# Patient Record
Sex: Female | Born: 1960 | Race: White | Hispanic: No | Smoking: Former smoker
Health system: Southern US, Community
[De-identification: ages and names within clinical notes are randomized; demographics above are authoritative.]

## PROBLEM LIST (undated history)

## (undated) DIAGNOSIS — K219 Gastro-esophageal reflux disease without esophagitis: Secondary | ICD-10-CM

## (undated) DIAGNOSIS — B001 Herpesviral vesicular dermatitis: Secondary | ICD-10-CM

## (undated) HISTORY — PX: TUBAL LIGATION: SHX77

## (undated) HISTORY — PX: BREAST SURGERY: SHX581

## (undated) HISTORY — PX: AUGMENTATION MAMMAPLASTY: SUR837

---

## 1998-10-11 ENCOUNTER — Other Ambulatory Visit: Admission: RE | Admit: 1998-10-11 | Discharge: 1998-10-11 | Payer: Self-pay | Admitting: Obstetrics & Gynecology

## 1999-11-17 ENCOUNTER — Other Ambulatory Visit: Admission: RE | Admit: 1999-11-17 | Discharge: 1999-11-17 | Payer: Self-pay | Admitting: Obstetrics & Gynecology

## 2000-12-21 ENCOUNTER — Other Ambulatory Visit: Admission: RE | Admit: 2000-12-21 | Discharge: 2000-12-21 | Payer: Self-pay | Admitting: Obstetrics & Gynecology

## 2002-02-07 ENCOUNTER — Other Ambulatory Visit: Admission: RE | Admit: 2002-02-07 | Discharge: 2002-02-07 | Payer: Self-pay | Admitting: Obstetrics & Gynecology

## 2003-02-19 ENCOUNTER — Other Ambulatory Visit: Admission: RE | Admit: 2003-02-19 | Discharge: 2003-02-19 | Payer: Self-pay | Admitting: Obstetrics & Gynecology

## 2003-03-23 ENCOUNTER — Ambulatory Visit (HOSPITAL_COMMUNITY): Admission: RE | Admit: 2003-03-23 | Discharge: 2003-03-23 | Payer: Self-pay | Admitting: Obstetrics & Gynecology

## 2005-01-27 ENCOUNTER — Other Ambulatory Visit: Admission: RE | Admit: 2005-01-27 | Discharge: 2005-01-27 | Payer: Self-pay | Admitting: Obstetrics & Gynecology

## 2005-10-08 ENCOUNTER — Encounter: Admission: RE | Admit: 2005-10-08 | Discharge: 2005-10-08 | Payer: Self-pay | Admitting: Family Medicine

## 2008-09-06 ENCOUNTER — Encounter: Admission: RE | Admit: 2008-09-06 | Discharge: 2008-09-06 | Payer: Self-pay | Admitting: Obstetrics & Gynecology

## 2009-09-09 ENCOUNTER — Encounter: Admission: RE | Admit: 2009-09-09 | Discharge: 2009-09-09 | Payer: Self-pay | Admitting: Obstetrics & Gynecology

## 2010-09-10 ENCOUNTER — Encounter: Admission: RE | Admit: 2010-09-10 | Discharge: 2010-09-10 | Payer: Self-pay | Admitting: Obstetrics & Gynecology

## 2010-11-02 HISTORY — PX: ABLATION: SHX5711

## 2011-03-20 NOTE — Op Note (Signed)
[name], Hannah Reese                          ACCOUNT NO.:  [id number]   MEDICAL RECORD NO.:  [id number]                   PATIENT TYPE:  AMB   LOCATION:  [facility]                                  FACILITY:  [facility]   PHYSICIAN:  Ilda Mori, M.D.                DATE OF BIRTH:  04/10/1961   DATE OF PROCEDURE:  03/23/2003  DATE OF DISCHARGE:                                 OPERATIVE REPORT   PREOPERATIVE DIAGNOSIS:  Voluntary sterilization.   POSTOPERATIVE DIAGNOSES:  1. Voluntary sterilization.  2. 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   PROCEDURES:  1. Bilateral tubal cautery for sterilization.  2. Excision of hydatid of Morgagni.   SURGEON:  Ilda Mori, M.D.   ANESTHESIA:  General endotracheal.   ESTIMATED BLOOD LOSS:  10 mL.   FINDINGS:  The right tube was perfectly normal, and the right ovary was  perfectly normal.  The left tube had a 1 cm hydatid of Morgagni cyst on a 3  cm stalk.  The left ovary appeared normal, but there were some filmy  adhesions from the left ovary to the posterior uterine serosa.  The rest of  the pelvis was free of adhesions or any evidence of endometriosis.   INDICATIONS:  This is a 50 year old gravida 2, para 2, who requests  sterilization.  A two to five per thousand failure rate was discussed with  the patient as well as the fact that the procedure was permanent and  alternative forms of nonpermanent birth control were available.   DESCRIPTION OF PROCEDURE:  The patient was taken to the operating room and  general endotracheal anesthesia was induced.  She was placed in the dorsal  lithotomy position.  The lower abdomen, vagina, and perineum were prepped  and draped in a sterile fashion.  A Veress needle was introduced through the  cul-de-sac and a pneumoperitoneum was created, and the bladder was emptied.  The Hulka tenaculum was introduced to the cervix and affixed to the anterior  lip of the cervix.  The surgeon regowned and gloved, and  local anesthesia  was used in the incision site.  The incision was then made in the umbilical  area and the 5 mm trocar was introduced.  The 5 mm laparoscope was then used  to visualize the pelvis.  An accessory 5 mm port was then placed in the  suprapubic area under direct visualization and the Kleppinger cautery  forceps was introduced.  The left tube was grasped at the isthmic halfway  junction and cauterized along a 3-4 cm length until no current was flowing  through the tube.  Normal-appearing tube of 1.5-2 cm was left proximal to  the burn site.  The identical procedure was then carried out on the right  fallopian tube.  The left tube was noted to have a 1 cm hydatid cyst on a 3  cm stalk.  To  prevent possible  torsion and pain from the hydatid, the stalk was cauterized and cut and the  hydatid was left free in the peritoneal cavity.  The procedure was then  terminated.  The instruments were removed.  The incisions were closed with  Dermabond adhesive and the patient left the operating room in good  condition.                                                Ilda Mori, M.D.    RK/MEDQ  D:  03/23/2003  T:  03/23/2003  Job:  [id number]

## 2011-06-04 ENCOUNTER — Encounter (HOSPITAL_COMMUNITY)
Admission: RE | Admit: 2011-06-04 | Discharge: 2011-06-04 | Disposition: A | Discharge status: Home / Self Care | Payer: BC Managed Care – PPO | Source: Ambulatory Visit | Attending: Obstetrics & Gynecology | Admitting: Obstetrics & Gynecology

## 2011-06-04 ENCOUNTER — Encounter (HOSPITAL_COMMUNITY): Payer: Self-pay

## 2011-06-04 HISTORY — DX: Gastro-esophageal reflux disease without esophagitis: K21.9

## 2011-06-04 HISTORY — DX: Herpesviral vesicular dermatitis: B00.1

## 2011-06-04 LAB — CBC
HCT: 41 % (ref 36.0–46.0)
Hemoglobin: 13.8 g/dL (ref 12.0–15.0)
MCH: 31.9 pg (ref 26.0–34.0)
MCHC: 33.7 g/dL (ref 30.0–36.0)

## 2011-06-04 NOTE — Anesthesia Preprocedure Evaluation (Signed)
Anesthesia Evaluation  Name, MR# and DOB Patient awake  General Assessment Comment  Reviewed: Allergy & Precautions, H&P  and Patient's Chart, lab work & pertinent test results  Airway Mallampati: I TM Distance: >3 FB Neck ROM: Full    Dental No notable dental hx (+) Teeth Intact   Pulmonaryneg pulmonary ROS    clear to auscultation  pulmonary exam normal   Cardiovascular Regular Normal   Neuro/PsychNegative Neurological ROS Negative Psych ROS  GI/Hepatic/Renal negative GI ROS, negative Liver ROS, and negative Renal ROS (+)  GERD Controlled and Medicated     Endo/Other  Negative Endocrine ROS (+)   Abdominal Normal abdominal exam  (+)   Musculoskeletal negative musculoskeletal ROS (+)  Hematology negative hematology ROS (+)   Peds  Reproductive/Obstetrics negative OB ROS   Anesthesia Other Findings             Anesthesia Physical Anesthesia Plan  ASA: II  Anesthesia Plan: General   Post-op Pain Management:    Induction: Intravenous  Airway Management Planned: LMA  Additional Equipment:   Intra-op Plan:   Post-operative Plan:   Informed Consent: I have reviewed the patients History and Physical, chart, labs and discussed the procedure including the risks, benefits and alternatives for the proposed anesthesia with the patient or authorized representative who has indicated his/her understanding and acceptance.     Plan Discussed with: Anesthesiologist (AP)  Anesthesia Plan Comments:         Anesthesia Quick Evaluation

## 2011-06-04 NOTE — Patient Instructions (Addendum)
20 Hannah Reese  06/04/2011   Your procedure is scheduled on:  8/10  Report to [facility] at 6 AM.  Call this number if you have problems the morning of surgery: [phone]   Remember:   Do not eat food:After Midnight.  Do not drink clear liquids: After Midnight.  Take these medicines the morning of surgery with A SIP OF WATER: Omeprazole   Do not wear jewelry, make-up or nail polish.  Do not wear lotions, powders, or perfumes. You may wear deodorant.  Do not shave 48 hours prior to surgery.  Do not bring valuables to the hospital.  Contacts, dentures or bridgework may not be worn into surgery.  Leave suitcase in the car. After surgery it may be brought to your room.  For patients admitted to the hospital, checkout time is 11:00 AM the day of discharge.   Patients discharged the day of surgery will not be allowed to drive home.  Name and phone number of your driver: daughter  Georga Bora  [phone]  Special Instructions:  Please read over the following fact sheets that you were given: use CHG wash per written instruction sheet

## 2011-06-11 ENCOUNTER — Encounter (HOSPITAL_COMMUNITY): Payer: Self-pay | Admitting: Obstetrics & Gynecology

## 2011-06-11 DIAGNOSIS — N393 Stress incontinence (female) (male): Secondary | ICD-10-CM | POA: Diagnosis present

## 2011-06-11 DIAGNOSIS — N920 Excessive and frequent menstruation with regular cycle: Secondary | ICD-10-CM | POA: Diagnosis present

## 2011-06-11 NOTE — H&P (Signed)
Hannah Reese is an 50 y.o. female who presents with a year history of urinary stress incontinence and menorrhagia (7 days of moderate to heavy bleeding per cycle.   Pertinent Gynecological History:  Last mammogram: normal Date: 2011 Last pap: normal Date: 2012 OB History: G1, P1    Past Medical History  Diagnosis Date  . GERD (gastroesophageal reflux disease)   . Recurrent cold sores     Past Surgical History  Procedure Date  . Breast surgery   . Tubal ligation     History reviewed. No pertinent family history.  Social History:  reports that she has quit smoking. She does not have any smokeless tobacco history on file. She reports that she drinks alcohol. She reports that she does not use illicit drugs.  Allergies: No Known Allergies  No prescriptions prior to admission    Review of Systems  All other systems reviewed and are negative.    There were no vitals taken for this visit. Physical Exam  Constitutional: She is oriented to person, place, and time. She appears well-developed.  HENT:  Head: Normocephalic.  Eyes: Pupils are equal, round, and reactive to light.  Neck: Normal range of motion. No thyromegaly present.  Cardiovascular: Normal rate, regular rhythm and normal heart sounds.   Respiratory: Effort normal and breath sounds normal.  GI: Soft.  Genitourinary: Vagina normal and uterus normal.       Mild cystocele and increased urethral mobility.  Neurological: She is alert and oriented to person, place, and time.  Skin: Skin is warm and dry.  Psychiatric: Her behavior is normal.    No results found for this or any previous visit (from the past 24 hour(s)).  No results found.  Assessment/Plan: Menorrhagia and USI.  Discussed options with patient and she requests surgical correction of these problems.  She is admitted for day surgery to include hysteroscopy, D&C, Nova sure endometrial ablation, and Monarc TOT.  Marie Borowski D 06/11/2011, 3:04 PM

## 2011-06-12 ENCOUNTER — Ambulatory Visit (HOSPITAL_COMMUNITY)
Admission: RE | Admit: 2011-06-12 | Discharge: 2011-06-12 | Disposition: A | Discharge status: Home / Self Care | Payer: BC Managed Care – PPO | Source: Ambulatory Visit | Attending: Obstetrics & Gynecology | Admitting: Obstetrics & Gynecology

## 2011-06-12 ENCOUNTER — Ambulatory Visit (HOSPITAL_COMMUNITY): Payer: BC Managed Care – PPO | Admitting: Anesthesiology

## 2011-06-12 ENCOUNTER — Encounter (HOSPITAL_COMMUNITY): Payer: Self-pay | Admitting: Anesthesiology

## 2011-06-12 ENCOUNTER — Other Ambulatory Visit: Payer: Self-pay | Admitting: Obstetrics & Gynecology

## 2011-06-12 ENCOUNTER — Encounter (HOSPITAL_COMMUNITY)
Admission: RE | Disposition: A | Discharge status: Home / Self Care | Payer: Self-pay | Source: Ambulatory Visit | Attending: Obstetrics & Gynecology

## 2011-06-12 DIAGNOSIS — Z01818 Encounter for other preprocedural examination: Secondary | ICD-10-CM | POA: Insufficient documentation

## 2011-06-12 DIAGNOSIS — N920 Excessive and frequent menstruation with regular cycle: Secondary | ICD-10-CM

## 2011-06-12 DIAGNOSIS — N393 Stress incontinence (female) (male): Secondary | ICD-10-CM

## 2011-06-12 DIAGNOSIS — Z01812 Encounter for preprocedural laboratory examination: Secondary | ICD-10-CM | POA: Insufficient documentation

## 2011-06-12 HISTORY — PX: BLADDER SUSPENSION: SHX72

## 2011-06-12 SURGERY — TRANSVAGINAL TAPE (TVT) PROCEDURE
Anesthesia: Choice

## 2011-06-12 MED ORDER — CEFAZOLIN SODIUM 1-5 GM-% IV SOLN
INTRAVENOUS | Status: DC | PRN
  Administered 2011-06-12: 1 g via INTRAVENOUS

## 2011-06-12 MED ORDER — FENTANYL CITRATE 0.05 MG/ML IJ SOLN
INTRAMUSCULAR | Status: DC | PRN
  Administered 2011-06-12: 25 ug via INTRAVENOUS
  Administered 2011-06-12: 100 ug via INTRAVENOUS

## 2011-06-12 MED ORDER — PROPOFOL 10 MG/ML IV EMUL
INTRAVENOUS | Status: AC
  Filled 2011-06-12: qty 20

## 2011-06-12 MED ORDER — FENTANYL CITRATE 0.05 MG/ML IJ SOLN
INTRAMUSCULAR | Status: AC
  Filled 2011-06-12: qty 5

## 2011-06-12 MED ORDER — OXYCODONE-ACETAMINOPHEN 5-325 MG PO TABS: 1.0000 | ORAL_TABLET | ORAL | Status: AC | PRN

## 2011-06-12 MED ORDER — ONDANSETRON HCL 4 MG/2ML IJ SOLN
INTRAMUSCULAR | Status: DC | PRN
  Administered 2011-06-12: 4 mg via INTRAVENOUS

## 2011-06-12 MED ORDER — BUPIVACAINE-EPINEPHRINE 0.5% -1:200000 IJ SOLN
INTRAMUSCULAR | Status: DC | PRN
  Administered 2011-06-12: 10 mL

## 2011-06-12 MED ORDER — LIDOCAINE HCL (CARDIAC) 20 MG/ML IV SOLN
INTRAVENOUS | Status: AC
  Filled 2011-06-12: qty 5

## 2011-06-12 MED ORDER — FENTANYL CITRATE 0.05 MG/ML IJ SOLN: 25.0000 ug | INTRAMUSCULAR | Status: DC | PRN

## 2011-06-12 MED ORDER — KETOROLAC TROMETHAMINE 30 MG/ML IJ SOLN
INTRAMUSCULAR | Status: DC | PRN
  Administered 2011-06-12: 30 mg via INTRAVENOUS

## 2011-06-12 MED ORDER — MIDAZOLAM HCL 2 MG/2ML IJ SOLN
INTRAMUSCULAR | Status: AC
  Filled 2011-06-12: qty 2

## 2011-06-12 MED ORDER — CEFAZOLIN SODIUM 1-5 GM-% IV SOLN: 1.0000 g | INTRAVENOUS | Status: DC

## 2011-06-12 MED ORDER — MEPERIDINE HCL 25 MG/ML IJ SOLN: 6.2500 mg | INTRAMUSCULAR | Status: DC | PRN

## 2011-06-12 MED ORDER — DEXAMETHASONE SODIUM PHOSPHATE 10 MG/ML IJ SOLN
INTRAMUSCULAR | Status: AC
  Filled 2011-06-12: qty 1

## 2011-06-12 MED ORDER — LACTATED RINGERS IV SOLN
INTRAVENOUS | Status: DC | PRN
  Administered 2011-06-12: 3000 mL via INTRAVENOUS

## 2011-06-12 MED ORDER — ONDANSETRON HCL 4 MG/2ML IJ SOLN
INTRAMUSCULAR | Status: AC
  Filled 2011-06-12: qty 2

## 2011-06-12 MED ORDER — LACTATED RINGERS IV SOLN
INTRAVENOUS | Status: DC
Start: 2011-06-12 — End: 2011-06-12
  Administered 2011-06-12: 09:00:00 via INTRAVENOUS
  Administered 2011-06-12: 1000 mL via INTRAVENOUS

## 2011-06-12 MED ORDER — PROPOFOL 10 MG/ML IV EMUL
INTRAVENOUS | Status: DC | PRN
  Administered 2011-06-12: 150 mg via INTRAVENOUS

## 2011-06-12 MED ORDER — CEFAZOLIN SODIUM 1-5 GM-% IV SOLN
INTRAVENOUS | Status: AC
  Filled 2011-06-12: qty 50

## 2011-06-12 MED ORDER — LIDOCAINE HCL (CARDIAC) 20 MG/ML IV SOLN
INTRAVENOUS | Status: DC | PRN
  Administered 2011-06-12: 80 mg via INTRAVENOUS

## 2011-06-12 MED ORDER — KETOROLAC TROMETHAMINE 30 MG/ML IJ SOLN
INTRAMUSCULAR | Status: AC
  Filled 2011-06-12: qty 1

## 2011-06-12 MED ORDER — DEXAMETHASONE SODIUM PHOSPHATE 4 MG/ML IJ SOLN
INTRAMUSCULAR | Status: DC | PRN
  Administered 2011-06-12: 6 mg via INTRAVENOUS

## 2011-06-12 MED ORDER — MIDAZOLAM HCL 5 MG/5ML IJ SOLN
INTRAMUSCULAR | Status: DC | PRN
  Administered 2011-06-12: 2 mg via INTRAVENOUS

## 2011-06-12 SURGICAL SUPPLY — 30 items
ABLATOR ENDOMETRIAL BIPOLAR (ABLATOR) ×2 IMPLANT
CANISTER SUCTION 2500CC (MISCELLANEOUS) ×2 IMPLANT
CATH ROBINSON RED A/P 16FR (CATHETERS) IMPLANT
CLOTH BEACON ORANGE TIMEOUT ST (SAFETY) ×2 IMPLANT
CONTAINER PREFILL 10% NBF 60ML (FORM) IMPLANT
DECANTER SPIKE VIAL GLASS SM (MISCELLANEOUS) IMPLANT
DERMABOND ADVANCED (GAUZE/BANDAGES/DRESSINGS) ×2 IMPLANT
DRAPE CAMERA CLOSED 9X96 (DRAPES) IMPLANT
DRAPE UTILITY XL STRL (DRAPES) ×2 IMPLANT
GAUZE PACKING 2X5 YD STERILE (GAUZE/BANDAGES/DRESSINGS) IMPLANT
GLOVE ECLIPSE 6.0 STRL STRAW (GLOVE) ×4 IMPLANT
GLOVE ECLIPSE 6.5 STRL STRAW (GLOVE) ×2 IMPLANT
GOWN PREVENTION PLUS LG XLONG (DISPOSABLE) ×8 IMPLANT
NEEDLE HYPO 22GX1.5 SAFETY (NEEDLE) ×2 IMPLANT
NEEDLE SPNL 22GX3.5 QUINCKE BK (NEEDLE) ×2 IMPLANT
NS IRRIG 1000ML POUR BTL (IV SOLUTION) ×2 IMPLANT
PACK HYSTEROSCOPY LF (CUSTOM PROCEDURE TRAY) ×2 IMPLANT
PACK VAGINAL WOMENS (CUSTOM PROCEDURE TRAY) ×2 IMPLANT
PAD PREP 24X48 CUFFED NSTRL (MISCELLANEOUS) ×2 IMPLANT
PLUG CATH AND CAP STER (CATHETERS) IMPLANT
SET CYSTO W/LG BORE CLAMP LF (SET/KITS/TRAYS/PACK) IMPLANT
SUBFASCIAL HAMMOCK MONARCH (Sling) ×2 IMPLANT
SUT VIC AB 2-0 SH 27 (SUTURE) ×3
SUT VIC AB 2-0 SH 27XBRD (SUTURE) ×3 IMPLANT
SUT VIC AB 2-0 UR6 27 (SUTURE) IMPLANT
SUT VIC AB 3-0 SH 27 (SUTURE) ×2
SUT VIC AB 3-0 SH 27XBRD (SUTURE) ×2 IMPLANT
TOWEL OR 17X24 6PK STRL BLUE (TOWEL DISPOSABLE) ×4 IMPLANT
TRAY FOLEY CATH 14FR (SET/KITS/TRAYS/PACK) ×2 IMPLANT
WATER STERILE IRR 1000ML POUR (IV SOLUTION) IMPLANT

## 2011-06-12 NOTE — Op Note (Signed)
Hannah Reese, Hannah Reese NO.:  [id number]  MEDICAL RECORD NO.:  [id number]  LOCATION:  [facility]                          FACILITY:  [facility]  PHYSICIAN:  Ilda Mori, M.D.   DATE OF BIRTH:  [birth year]  DATE OF PROCEDURE:  06/12/2011 DATE OF DISCHARGE:  06/12/2011                              OPERATIVE REPORT   PREOPERATIVE DIAGNOSES:  Urinary stress incontinence and menorrhagia.  POSTOPERATIVE DIAGNOSES:  Urinary stress incontinence and menorrhagia.  PROCEDURE:  Hysteroscopy, D and C, NovaSure endometrial ablation, transobturator tape urethral suspension.  SURGEON:  Ilda Mori  ANESTHESIA:  General endotracheal.  ESTIMATED BLOOD LOSS:  100 mL.  FINDINGS:  The uterine cavity was small and atrophic.  There was a small polyp noted at the right tubal ostia, the rest of the cavity appeared normal.  In the vagina, there was a 1+ cystocele and slight increased urethral mobility noted.  COMPLICATIONS:  None.  INDICATIONS:  This is a 50 year old female with over a year history of heavy periods and persistent urinary stress incontinence.  These problems were discussed with the patient, alternative modes of treatment were discussed and decision was made to proceed with NovaSure ablation and transobturator tape urethral suspension.  PROCEDURE IN DETAIL:  The patient was brought to the operating room and general anesthesia was induced.  She was then placed in the dorsal lithotomy position and the perineum and vagina were prepped and draped in sterile fashion and the bladder was catheterized with a Foley catheter. The cervix was identified infiltrated with 5 mL of 0.5% bupivacaine and 1:200,000 epinephrine.  The cervix was sounded to 3 cm.  The uterine cavity was sounded to 9 cm leaving a cavity depth of 6 cm.  The internal os was then dilated with Shawnie Pons dilators to a 21-French.  A diagnostic hysteroscope was introduced.  A small polyp was seen at the right  tubal ostia.  The rest of the endometrium looked normal.  A polyp forceps was used to remove the polyp that had been noted.  The endocervical os was then dilated to 25-French.  The NovaSure was placed, and the CO2 test passed.  The NovaSure was deployed and the cavity width was only 2.5 cm and it remained such even after manipulating the instrument.  The ablation was then carried out for 64 seconds.  The hysteroscope was reintroduced and an adequate endometrial burn was noted to have taken place.  The attention was then turned to the suburethral area.  A vertical incision was made beginning 0.5 cm below the urethral meatus for approximately 3 cm in length.  The mucosa was then dissected free, so that a tunnel was created to the ischiopubic rami bilaterally.  Incisions were then made over the obturator foramena bilaterally which were 5 cm lateral to the clitoral hood.  The Monarch needles (left and right) were then passed from these groin incisions through the obturator foramina onto the surgeon's finger which was placed in the left and then the right vaginal tunnel.  The Monarch mesh tape was then affixed to the needles, and the needles were removed, leaving a sling of mesh underneath the mid urethra.  A Kelly clamp was  placed between the urethra and the mesh to obtain the proper tension.  The plastic sheath was removed and the tape was cut at the level of the Skin in the right and left groin, and these incisions were closed with Dermabond.  The suburethral vaginal mucosa was then closed with interrupted 3-0 Vicryl suture.  The Foley catheter was then disconnected from the bag and the bladder was completely emtied.  The urine was clear.  The bladder was then filled retrograde through the foley with 220 mL of saline to expedite a post operative voiding trial prior to discharge from PACU, and the Foley was removed.  The procedure was then terminated and the patient left the operating room in good  condition.    Ilda Mori, M.D.     RK/MEDQ  D:  06/12/2011  T:  06/12/2011  Job:  [id number]

## 2011-06-12 NOTE — Brief Op Note (Addendum)
06/12/2011  10:05 AM  PATIENT:  Hannah Reese  50 y.o. female  PRE-OPERATIVE DIAGNOSIS:  stress urinary incontinence,  Menorrhagia  POST-OPERATIVE DIAGNOSIS:  stress urinary incontinence, Menorrhagia  PROCEDURE:  Procedure(s): TRANSOBTURATOR TAPE (TOT) PROCEDURE DILATATION & CURETTAGE/HYSTEROSCOPY WITH NOVASURE ABLATION  SURGEON:  Surgeon(s): Aadin Gaut Rosalio Macadamia  PHYSICIAN ASSISTANT:   ASSISTANTS: none   ANESTHESIA:   general  ESTIMATED BLOOD LOSS: 100 ML   BLOOD ADMINISTERED:none  DRAINS: none   LOCAL MEDICATIONS USED:  BUPIVICAINE 10 CC  SPECIMEN: Endometrial tissue, endometrial polyp DISPOSITION OF SPECIMEN:  PATHOLOGY  COUNTS:  YES  TOURNIQUET:  * No tourniquets in log *  DICTATION #: [id number]  PLAN OF CARE: Discharge from PACU  PATIENT DISPOSITION:  PACU - hemodynamically stable.   Delay start of Pharmacological VTE agent (>24hrs) due to surgical blood loss or risk of bleeding:  no

## 2011-06-12 NOTE — Anesthesia Postprocedure Evaluation (Signed)
  Anesthesia Post-op Note  Patient: Hannah Reese  Procedure(s) Performed:  TRANSVAGINAL TAPE (TVT) PROCEDURE - Transobturator; DILATATION & CURETTAGE/HYSTEROSCOPY WITH NOVASURE ABLATION - Suction Dilatation and Evacuation  Patient Location: PACU  Anesthesia Type: General  Level of Consciousness: awake, alert  and oriented  Airway and Oxygen Therapy: Patient Spontanous Breathing  Post-op Pain: none  Post-op Assessment: Post-op Vital signs reviewed and Patient's Cardiovascular Status Stable  Post-op Vital Signs: Reviewed and stable  Complications: No apparent anesthesia complications

## 2011-06-12 NOTE — Transfer of Care (Signed)
Immediate Anesthesia Transfer of Care Note  Patient: Hannah Reese  Procedure(s) Performed:  TRANSVAGINAL TAPE (TVT) PROCEDURE - Transobturator; DILATATION & CURETTAGE/HYSTEROSCOPY WITH NOVASURE ABLATION - Suction Dilatation and Evacuation  Patient Location: PACU  Anesthesia Type: General  Level of Consciousness: awake, alert  and oriented  Airway & Oxygen Therapy: Patient Spontanous Breathing and Patient connected to nasal cannula oxygen  Post-op Assessment: Report given to PACU RN and Post -op Vital signs reviewed and stable  Post vital signs: Reviewed and stable  Complications: No apparent anesthesia complications

## 2011-07-08 ENCOUNTER — Encounter (HOSPITAL_COMMUNITY): Payer: Self-pay | Admitting: Obstetrics & Gynecology

## 2011-09-07 ENCOUNTER — Other Ambulatory Visit: Payer: Self-pay | Admitting: Obstetrics & Gynecology

## 2011-09-07 DIAGNOSIS — Z1231 Encounter for screening mammogram for malignant neoplasm of breast: Secondary | ICD-10-CM

## 2011-10-01 ENCOUNTER — Ambulatory Visit
Admission: RE | Admit: 2011-10-01 | Discharge: 2011-10-01 | Disposition: A | Discharge status: Home / Self Care | Payer: BC Managed Care – PPO | Source: Ambulatory Visit | Attending: Obstetrics & Gynecology | Admitting: Obstetrics & Gynecology

## 2011-10-01 DIAGNOSIS — Z1231 Encounter for screening mammogram for malignant neoplasm of breast: Secondary | ICD-10-CM

## 2012-08-31 ENCOUNTER — Other Ambulatory Visit: Payer: Self-pay | Admitting: Obstetrics & Gynecology

## 2012-08-31 DIAGNOSIS — Z1231 Encounter for screening mammogram for malignant neoplasm of breast: Secondary | ICD-10-CM

## 2012-10-06 ENCOUNTER — Ambulatory Visit
Admission: RE | Admit: 2012-10-06 | Discharge: 2012-10-06 | Disposition: A | Discharge status: Home / Self Care | Payer: BC Managed Care – PPO | Source: Ambulatory Visit | Attending: Obstetrics & Gynecology | Admitting: Obstetrics & Gynecology

## 2012-10-06 DIAGNOSIS — Z1231 Encounter for screening mammogram for malignant neoplasm of breast: Secondary | ICD-10-CM

## 2013-02-06 ENCOUNTER — Encounter: Payer: Self-pay | Admitting: Nurse Practitioner

## 2013-02-06 ENCOUNTER — Ambulatory Visit (INDEPENDENT_AMBULATORY_CARE_PROVIDER_SITE_OTHER): Payer: BC Managed Care – PPO | Admitting: Nurse Practitioner

## 2013-02-06 VITALS — BP 105/70 | HR 65 | Temp 97.9°F | Ht 63.0 in | Wt 164.0 lb

## 2013-02-06 DIAGNOSIS — B009 Herpesviral infection, unspecified: Secondary | ICD-10-CM

## 2013-02-06 DIAGNOSIS — M545 Low back pain, unspecified: Secondary | ICD-10-CM

## 2013-02-06 DIAGNOSIS — K219 Gastro-esophageal reflux disease without esophagitis: Secondary | ICD-10-CM

## 2013-02-06 MED ORDER — CYCLOBENZAPRINE HCL 5 MG PO TABS: 5.0000 mg | ORAL_TABLET | Freq: Three times a day (TID) | ORAL | Status: DC | PRN

## 2013-02-06 MED ORDER — IBUPROFEN 800 MG PO TABS: 800.0000 mg | ORAL_TABLET | Freq: Three times a day (TID) | ORAL | Status: DC | PRN

## 2013-02-06 MED ORDER — OMEPRAZOLE 40 MG PO CPDR: 40.0000 mg | DELAYED_RELEASE_CAPSULE | Freq: Every day | ORAL | Status: DC

## 2013-02-06 MED ORDER — VALACYCLOVIR HCL 1 G PO TABS: 500.0000 mg | ORAL_TABLET | Freq: Every day | ORAL | Status: DC

## 2013-02-06 MED ORDER — IBUPROFEN 800 MG PO TABS: 800.0000 mg | ORAL_TABLET | Freq: Three times a day (TID) | ORAL | Status: AC | PRN

## 2013-02-06 NOTE — Progress Notes (Signed)
  Subjective:    Patient ID: Hannah Reese, female    DOB: [birth year], 52 y.o.   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 Patient takes Valtrex 1g daily. No recent outbreaks GERD This is a chronic problem. She is currently on Omperazole which works very well for her. Low back pain Usually occurs after she has done a lot physical activity. Will last for 2 days when starts. Standing increases pain. Motrin helps. Denies numbness or tingling down legs.   Review of Systems  Constitutional: Negative.   HENT: Negative.   Eyes: Negative.   Respiratory: Negative.   Cardiovascular: Negative.   Gastrointestinal: Negative.   Genitourinary: Negative.   Musculoskeletal: Positive for back pain.  Allergic/Immunologic: Negative.   Neurological: Negative.  Negative for numbness.  Hematological: Negative.   Psychiatric/Behavioral: Negative.        Objective:   Physical Exam  Constitutional: She is oriented to person, place, and time. She appears well-developed and well-nourished.  Cardiovascular: Normal rate, normal heart sounds and intact distal pulses.   No murmur heard. Pulmonary/Chest: Effort normal and breath sounds normal.  Abdominal: Soft. Bowel sounds are normal.  Musculoskeletal: Normal range of motion.  Normal ROM lumbar spine. DTR's = bil (-)SLR bil Motor strength and sensation intact distally bil  Neurological: She is alert and oriented to person, place, and time.  Skin: Skin is warm and dry.  Psychiatric: She has a normal mood and affect. Her behavior is normal. Judgment and thought content normal.          Assessment & Plan:  Low back pain - Plan: cyclobenzaprine (FLEXERIL) 5 MG tablet, ibuprofen (ADVIL,MOTRIN) 800 MG tablet  HSV-1 (herpes simplex virus 1) infection - Plan: valACYclovir (VALTREX) 1000 MG tablet  GERD (gastroesophageal reflux disease) - Plan: omeprazole (PRILOSEC) 40 MG capsule Patient will  make appointment for a CPE  Mary-Margaret Daphine Deutscher, FNP

## 2013-02-06 NOTE — Patient Instructions (Signed)
Back Pain, Adult Low back pain is very common. About 1 in 5 people have back pain.The cause of low back pain is rarely dangerous. The pain often gets better over time.About half of people with a sudden onset of back pain feel better in just 2 weeks. About 8 in 10 people feel better by 6 weeks.  CAUSES Some common causes of back pain include:  Strain of the muscles or ligaments supporting the spine.  Wear and tear (degeneration) of the spinal discs.  Arthritis.  Direct injury to the back. DIAGNOSIS Most of the time, the direct cause of low back pain is not known.However, back pain can be treated effectively even when the exact cause of the pain is unknown.Answering your caregiver's questions about your overall health and symptoms is one of the most accurate ways to make sure the cause of your pain is not dangerous. If your caregiver needs more information, he or she may order lab work or imaging tests (X-rays or MRIs).However, even if imaging tests show changes in your back, this usually does not require surgery. HOME CARE INSTRUCTIONS For many people, back pain returns.Since low back pain is rarely dangerous, it is often a condition that people can learn to manageon their own.   Remain active. It is stressful on the back to sit or stand in one place. Do not sit, drive, or stand in one place for more than 30 minutes at a time. Take short walks on level surfaces as soon as pain allows.Try to increase the length of time you walk each day.  Do not stay in bed.Resting more than 1 or 2 days can delay your recovery.  Do not avoid exercise or work.Your body is made to move.It is not dangerous to be active, even though your back may hurt.Your back will likely heal faster if you return to being active before your pain is gone.  Pay attention to your body when you bend and lift. Many people have less discomfortwhen lifting if they bend their knees, keep the load close to their bodies,and  avoid twisting. Often, the most comfortable positions are those that put less stress on your recovering back.  Find a comfortable position to sleep. Use a firm mattress and lie on your side with your knees slightly bent. If you lie on your back, put a pillow under your knees.  Only take over-the-counter or prescription medicines as directed by your caregiver. Over-the-counter medicines to reduce pain and inflammation are often the most helpful.Your caregiver may prescribe muscle relaxant drugs.These medicines help dull your pain so you can more quickly return to your normal activities and healthy exercise.  Put ice on the injured area.  Put ice in a plastic bag.  Place a towel between your skin and the bag.  Leave the ice on for 15 to 20 minutes, 3 to 4 times a day for the first 2 to 3 days. After that, ice and heat may be alternated to reduce pain and spasms.  Ask your caregiver about trying back exercises and gentle massage. This may be of some benefit.  Avoid feeling anxious or stressed.Stress increases muscle tension and can worsen back pain.It is important to recognize when you are anxious or stressed and learn ways to manage it.Exercise is a great option. SEEK MEDICAL CARE IF:  You have pain that is not relieved with rest or medicine.  You have pain that does not improve in 1 week.  You have new symptoms.  You are generally   not feeling well. SEEK IMMEDIATE MEDICAL CARE IF:   You have pain that radiates from your back into your legs.  You develop new bowel or bladder control problems.  You have unusual weakness or numbness in your arms or legs.  You develop nausea or vomiting.  You develop abdominal pain.  You feel faint. Document Released: 10/19/2005 Document Revised: 04/19/2012 Document Reviewed: 03/09/2011 Exit[employer] Patient Information 2013 [employer].  

## 2013-02-06 NOTE — Addendum Note (Signed)
Addended by: Bennie Pierini on: 02/06/2013 12:02 PM   Modules accepted: Orders

## 2013-08-24 ENCOUNTER — Other Ambulatory Visit: Payer: Self-pay

## 2013-08-30 ENCOUNTER — Other Ambulatory Visit: Payer: Self-pay

## 2013-08-30 DIAGNOSIS — Z1231 Encounter for screening mammogram for malignant neoplasm of breast: Secondary | ICD-10-CM

## 2013-08-30 DIAGNOSIS — Z9882 Breast implant status: Secondary | ICD-10-CM

## 2013-10-09 ENCOUNTER — Ambulatory Visit
Admission: RE | Admit: 2013-10-09 | Discharge: 2013-10-09 | Disposition: A | Discharge status: Home / Self Care | Payer: BC Managed Care – PPO | Source: Ambulatory Visit

## 2013-10-09 DIAGNOSIS — Z9882 Breast implant status: Secondary | ICD-10-CM

## 2013-10-09 DIAGNOSIS — Z1231 Encounter for screening mammogram for malignant neoplasm of breast: Secondary | ICD-10-CM

## 2013-10-10 ENCOUNTER — Other Ambulatory Visit: Payer: Self-pay | Admitting: Obstetrics & Gynecology

## 2013-10-10 DIAGNOSIS — R928 Other abnormal and inconclusive findings on diagnostic imaging of breast: Secondary | ICD-10-CM

## 2013-10-20 ENCOUNTER — Other Ambulatory Visit: Payer: BC Managed Care – PPO

## 2013-10-20 ENCOUNTER — Ambulatory Visit
Admission: RE | Admit: 2013-10-20 | Discharge: 2013-10-20 | Disposition: A | Discharge status: Home / Self Care | Payer: BC Managed Care – PPO | Source: Ambulatory Visit | Attending: Obstetrics & Gynecology | Admitting: Obstetrics & Gynecology

## 2013-10-20 DIAGNOSIS — R928 Other abnormal and inconclusive findings on diagnostic imaging of breast: Secondary | ICD-10-CM

## 2013-12-09 ENCOUNTER — Other Ambulatory Visit: Payer: Self-pay | Admitting: Nurse Practitioner

## 2013-12-11 NOTE — Telephone Encounter (Signed)
Last seen 02/06/13  MMM

## 2014-01-08 ENCOUNTER — Ambulatory Visit (INDEPENDENT_AMBULATORY_CARE_PROVIDER_SITE_OTHER): Payer: BC Managed Care – PPO | Admitting: Nurse Practitioner

## 2014-01-08 ENCOUNTER — Encounter (INDEPENDENT_AMBULATORY_CARE_PROVIDER_SITE_OTHER): Payer: Self-pay

## 2014-01-08 ENCOUNTER — Encounter: Payer: Self-pay | Admitting: Nurse Practitioner

## 2014-01-08 VITALS — BP 120/70 | HR 66 | Temp 97.1°F | Ht 63.0 in | Wt 168.0 lb

## 2014-01-08 DIAGNOSIS — F329 Major depressive disorder, single episode, unspecified: Secondary | ICD-10-CM

## 2014-01-08 DIAGNOSIS — Z23 Encounter for immunization: Secondary | ICD-10-CM

## 2014-01-08 DIAGNOSIS — F3289 Other specified depressive episodes: Secondary | ICD-10-CM

## 2014-01-08 DIAGNOSIS — G5600 Carpal tunnel syndrome, unspecified upper limb: Secondary | ICD-10-CM

## 2014-01-08 DIAGNOSIS — Z1211 Encounter for screening for malignant neoplasm of colon: Secondary | ICD-10-CM

## 2014-01-08 DIAGNOSIS — F32A Depression, unspecified: Secondary | ICD-10-CM

## 2014-01-08 DIAGNOSIS — B009 Herpesviral infection, unspecified: Secondary | ICD-10-CM

## 2014-01-08 MED ORDER — OMEPRAZOLE 40 MG PO CPDR: DELAYED_RELEASE_CAPSULE | ORAL | Status: DC

## 2014-01-08 MED ORDER — VALACYCLOVIR HCL 1 G PO TABS: 500.0000 mg | ORAL_TABLET | Freq: Every day | ORAL | Status: DC

## 2014-01-08 MED ORDER — CITALOPRAM HYDROBROMIDE 20 MG PO TABS: 20.0000 mg | ORAL_TABLET | Freq: Every day | ORAL | Status: DC

## 2014-01-08 NOTE — Patient Instructions (Signed)
Carpal Tunnel Release Carpal tunnel release is done to relieve the pressure on the nerves and tendons on the bottom side of your wrist.  LET YOUR CAREGIVER KNOW ABOUT:   Allergies to food or medicine.  Medicines taken, including vitamins, herbs, eyedrops, over-the-counter medicines, and creams.  Use of steroids (by mouth or creams).  Previous problems with anesthetics or numbing medicines.  History of bleeding problems or blood clots.  Previous surgery.  Other health problems, including diabetes and kidney problems.  Possibility of pregnancy, if this applies. RISKS AND COMPLICATIONS  Some problems that may happen after this procedure include:  Infection.  Damage to the nerves, arteries or tendons could occur. This would be very uncommon.  Bleeding. BEFORE THE PROCEDURE   This surgery may be done while you are asleep (general anesthetic) or may be done under a block where only your forearm and the surgical area is numb.  If the surgery is done under a block, the numbness will gradually wear off within several hours after surgery. HOME CARE INSTRUCTIONS   Have a responsible person with you for 24 hours.  Do not drive a car or use public transportation for 24 hours.  Only take over-the-counter or prescription medicines for pain, discomfort, or fever as directed by your caregiver. Take them as directed.  You may put ice on the palm side of the affected wrist.  Put ice in a plastic bag.  Place a towel between your skin and the bag.  Leave the ice on for 20 to 30 minutes, 4 times per day.  If you were given a splint to keep your wrist from bending, use it as directed. It is important to wear the splint at night or as directed. Use the splint for as long as you have pain or numbness in your hand, arm, or wrist. This may take 1 to 2 months.  Keep your hand raised (elevated) above the level of your heart as much as possible. This keeps swelling down and helps with  discomfort.  Change bandages (dressings) as directed.  Keep the wound clean and dry. SEEK MEDICAL CARE IF:   You develop pain not relieved with medications.  You develop numbness of your hand.  You develop bleeding from your surgical site.  You have an oral temperature above 102 F (38.9 C).  You develop redness or swelling of the surgical site.  You develop new, unexplained problems. SEEK IMMEDIATE MEDICAL CARE IF:   You develop a rash.  You have difficulty breathing.  You develop any reaction or side effects to medications given. Document Released: 01/09/2004 Document Revised: 01/11/2012 Document Reviewed: 08/25/2007 Exit[employer] Patient Information 2014 [employer].  

## 2014-01-08 NOTE — Progress Notes (Signed)
Subjective:    Patient ID: Hannah Reese, female    DOB: 10/02/1961, 53 y.o.   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 Patient takes Valtrex 1g daily. No recent outbreaks GERD This is a chronic problem. She is currently on Omperazole which works very well for her. Low back pain Usually occurs after she has done a lot physical activity. Will last for 2 days when starts. Standing increases pain. Motrin helps. Denies numbness or tingling down legs. * patient c/o mild depression- would like something that won't cause weight gain. *Patient says that fingers on right hand go numb- wakes her up at night at times.  Review of Systems  Constitutional: Negative.   HENT: Negative.   Eyes: Negative.   Respiratory: Negative.   Cardiovascular: Negative.   Gastrointestinal: Negative.   Genitourinary: Negative.   Musculoskeletal: Positive for back pain.  Allergic/Immunologic: Negative.   Neurological: Negative.  Negative for numbness.  Hematological: Negative.   Psychiatric/Behavioral: Negative.        Objective:   Physical Exam  Constitutional: She is oriented to person, place, and time. She appears well-developed and well-nourished.  Cardiovascular: Normal rate, normal heart sounds and intact distal pulses.   No murmur heard. Pulmonary/Chest: Effort normal and breath sounds normal.  Abdominal: Soft. Bowel sounds are normal.  Musculoskeletal: Normal range of motion.  Normal ROM lumbar spine. DTR's = bil (-)SLR bil Motor strength and sensation intact distally bil  (+) phalens on right hand (+) tinel right hand  Neurological: She is alert and oriented to person, place, and time.  Skin: Skin is warm and dry.  Psychiatric: She has a normal mood and affect. Her behavior is normal. Judgment and thought content normal.    BP 120/70  Pulse 66  Temp(Src) 97.1 F (36.2 C) (Oral)  Ht 5\' 3"  (1.6 m)  Wt 168 lb (76.204 kg)   BMI 29.77 kg/m2       Assessment & Plan:   1. Encounter for screening colonoscopy   2. HSV-1 (herpes simplex virus 1) infection   3. Carpal tunnel syndrome   4. Depression    Orders Placed This Encounter  Procedures  . Ambulatory referral to Gastroenterology    Referral Priority:  Routine    Referral Type:  Consultation    Referral Reason:  Specialty Services Required    Requested Specialty:  Gastroenterology    Number of Visits Requested:  1  . Nerve conduction test    Standing Status: Future     Number of Occurrences:      Standing Expiration Date: 01/09/2015    Order Specific Question:  Where should this test be performed?    Answer:  [facility]   Meds ordered this encounter  Medications  . omeprazole (PRILOSEC) 40 MG capsule    Sig: TAKE 1 CAPSULE DAILY    Dispense:  90 capsule    Refill:  1    Order Specific Question:  Supervising Provider    Answer:  Ernestina Penna [[id number]]  . valACYclovir (VALTREX) 1000 MG tablet    Sig: Take 0.5 tablets (500 mg total) by mouth daily.    Dispense:  90 tablet    Refill:  3    Order Specific Question:  Supervising Provider    Answer:  Ernestina Penna [[id number]]  . citalopram (CELEXA) 20 MG tablet    Sig: Take 1 tablet (20 mg total) by mouth daily.  Dispense:  30 tablet    Refill:  3    Order Specific Question:  Supervising Provider    Answer:  Ernestina PennaMOORE, DONALD W [[id number]]    Labs pending Health maintenance reviewed Diet and exercise encouraged Continue all meds Follow up  In 6 months    Mary-Margaret Daphine DeutscherMartin, FNP

## 2014-01-08 NOTE — Addendum Note (Signed)
Addended by: Bennie PieriniMARTIN, MARY-MARGARET on: 01/08/2014 12:34 PM   Modules accepted: Orders

## 2014-01-08 NOTE — Addendum Note (Signed)
Addended by: Bernita BuffyANDERSON, Caira Poche G on: 01/08/2014 12:55 PM   Modules accepted: Orders

## 2014-01-09 ENCOUNTER — Encounter: Payer: Self-pay | Admitting: Internal Medicine

## 2014-01-10 LAB — CMP14+EGFR
A/G RATIO: 2 (ref 1.1–2.5)
ALT: 11 IU/L (ref 0–32)
AST: 18 IU/L (ref 0–40)
Albumin: 4.6 g/dL (ref 3.5–5.5)
Alkaline Phosphatase: 87 IU/L (ref 39–117)
BUN / CREAT RATIO: 16 (ref 9–23)
BUN: 15 mg/dL (ref 6–24)
CO2: 26 mmol/L (ref 18–29)
Calcium: 10 mg/dL (ref 8.7–10.2)
Chloride: 100 mmol/L (ref 97–108)
Creatinine, Ser: 0.92 mg/dL (ref 0.57–1.00)
GFR, EST AFRICAN AMERICAN: 84 mL/min/{1.73_m2} (ref >59)
GFR, EST NON AFRICAN AMERICAN: 73 mL/min/{1.73_m2} (ref >59)
GLUCOSE: 105 mg/dL — AB (ref 65–99)
Globulin, Total: 2.3 g/dL (ref 1.5–4.5)
POTASSIUM: 4.5 mmol/L (ref 3.5–5.2)
Sodium: 141 mmol/L (ref 134–144)
TOTAL PROTEIN: 6.9 g/dL (ref 6.0–8.5)
Total Bilirubin: 0.4 mg/dL (ref 0.0–1.2)

## 2014-01-10 LAB — NMR, LIPOPROFILE
CHOLESTEROL: 214 mg/dL — AB (ref <200)
HDL Cholesterol by NMR: 80 mg/dL (ref >=40)
HDL Particle Number: 45.4 umol/L (ref >=30.5)
LDL Particle Number: 1304 nmol/L — ABNORMAL HIGH (ref <1000)
LDL SIZE: 21.8 nm (ref >20.5)
LDLC SERPL CALC-MCNC: 119 mg/dL — ABNORMAL HIGH (ref <100)
LP-IR Score: <25 (ref <=45)
TRIGLYCERIDES BY NMR: 73 mg/dL (ref <150)

## 2014-01-11 LAB — SPECIMEN STATUS REPORT

## 2014-01-11 LAB — HEPATITIS B SURFACE ANTIBODY,QUALITATIVE: Hep B Surface Ab, Qual: NONREACTIVE

## 2014-03-02 ENCOUNTER — Ambulatory Visit (AMBULATORY_SURGERY_CENTER): Payer: Self-pay | Admitting: *Deleted

## 2014-03-02 VITALS — Ht 63.0 in | Wt 168.0 lb

## 2014-03-02 DIAGNOSIS — Z1211 Encounter for screening for malignant neoplasm of colon: Secondary | ICD-10-CM

## 2014-03-02 MED ORDER — NA SULFATE-K SULFATE-MG SULF 17.5-3.13-1.6 GM/177ML PO SOLN: 1.0000 | Freq: Once | ORAL | Status: DC

## 2014-03-02 NOTE — Progress Notes (Signed)
No egg or soy allergy. No anesthesia problems.  No home O2.  No diet meds.  

## 2014-03-09 ENCOUNTER — Encounter: Payer: Self-pay | Admitting: Internal Medicine

## 2014-03-09 ENCOUNTER — Ambulatory Visit (AMBULATORY_SURGERY_CENTER): Payer: BC Managed Care – PPO | Admitting: Internal Medicine

## 2014-03-09 VITALS — BP 104/67 | HR 58 | Temp 97.4°F | Resp 57 | Ht 63.0 in | Wt 168.0 lb

## 2014-03-09 DIAGNOSIS — Z1211 Encounter for screening for malignant neoplasm of colon: Secondary | ICD-10-CM

## 2014-03-09 MED ORDER — SODIUM CHLORIDE 0.9 % IV SOLN: 500.0000 mL | INTRAVENOUS | Status: DC

## 2014-03-09 NOTE — Op Note (Signed)
[facility] [address] [place], [id number]   COLONOSCOPY PROCEDURE REPORT  PATIENT: Hannah Reese, [name]  MR#: [id number] BIRTHDATE: May 03, 1961 , 52  yrs. old GENDER: Female ENDOSCOPIST: Iva Booparl E Gessner, MD, [facility] REFERRED HY:QMVHBY:Mary Sheron NightingaleM Martin, N.P. PROCEDURE DATE:  03/09/2014 PROCEDURE:   Colonoscopy, screening First Screening Colonoscopy - Avg.  risk and is 50 yrs.  old or older Yes.  Prior Negative Screening - Now for repeat screening. N/A  History of Adenoma - Now for follow-up colonoscopy & has been > or = to 3 yrs.  N/A  Polyps Removed Today? No.  Recommend repeat exam, <10 yrs? No. ASA CLASS:   Class II INDICATIONS:average risk screening and first colonoscopy. MEDICATIONS: propofol (Diprivan) 300mg  IV, MAC sedation, administered by CRNA, and These medications were titrated to patient response per physician's verbal order  DESCRIPTION OF PROCEDURE:   After the risks benefits and alternatives of the procedure were thoroughly explained, informed consent was obtained.  A digital rectal exam revealed no abnormalities of the rectum.   The LB PFC-H190 [id number]  endoscope was introduced through the anus and advanced to the cecum, which was identified by both the appendix and ileocecal valve. No adverse events experienced.   The quality of the prep was excellent using Suprep  The instrument was then slowly withdrawn as the colon was fully examined.      COLON FINDINGS: A normal appearing cecum, ileocecal valve, and appendiceal orifice were identified.  The ascending, hepatic flexure, transverse, splenic flexure, descending, sigmoid colon and rectum appeared unremarkable.  No polyps or cancers were seen.   A right colon retroflexion was performed.  Retroflexed views revealed no abnormalities. The time to cecum=3 minutes 54 seconds. Withdrawal time=7 minutes 31 seconds.  The scope was withdrawn and the procedure completed. COMPLICATIONS: There were no  complications.  ENDOSCOPIC IMPRESSION: Normal colonoscopy - excellent prep - first colonoscopy  RECOMMENDATIONS: Repeat colonoscopy 10 years - 2025   eSigned:  Iva Booparl E Gessner, MD, [facility] 03/09/2014 11:01 AM   cc: Bennie PieriniMary Margaret Martin, NP and The Patient

## 2014-03-09 NOTE — Patient Instructions (Addendum)
Your colonoscopy was normal and the prep was great!  Next routine colonoscopy in 10 years - 2025  I appreciate the opportunity to care for you. Iva Booparl E. Gessner, MD, [facility]    Discharge instructions given with verbal understanding. Normal exam. Resume previous medications. YOU HAD AN ENDOSCOPIC PROCEDURE TODAY AT THE [facility]: Refer to the procedure report that was given to you for any specific questions about what was found during the examination.  If the procedure report does not answer your questions, please call your gastroenterologist to clarify.  If you requested that your care partner not be given the details of your procedure findings, then the procedure report has been included in a sealed envelope for you to review at your convenience later.  YOU SHOULD EXPECT: Some feelings of bloating in the abdomen. Passage of more gas than usual.  Walking can help get rid of the air that was put into your GI tract during the procedure and reduce the bloating. If you had a lower endoscopy (such as a colonoscopy or flexible sigmoidoscopy) you may notice spotting of blood in your stool or on the toilet paper. If you underwent a bowel prep for your procedure, then you may not have a normal bowel movement for a few days.  DIET: Your first meal following the procedure should be a light meal and then it is ok to progress to your normal diet.  A half-sandwich or bowl of soup is an example of a good first meal.  Heavy or fried foods are harder to digest and may make you feel nauseous or bloated.  Likewise meals heavy in dairy and vegetables can cause extra gas to form and this can also increase the bloating.  Drink plenty of fluids but you should avoid alcoholic beverages for 24 hours.  ACTIVITY: Your care partner should take you home directly after the procedure.  You should plan to take it easy, moving slowly for the rest of the day.  You can resume normal activity the day after the procedure  however you should NOT DRIVE or use heavy machinery for 24 hours (because of the sedation medicines used during the test).    SYMPTOMS TO REPORT IMMEDIATELY: A gastroenterologist can be reached at any hour.  During normal business hours, 8:30 AM to 5:00 PM Monday through Friday, call [phone].  After hours and on weekends, please call the GI answering service at [phone] who will take a message and have the physician on call contact you.   Following lower endoscopy (colonoscopy or flexible sigmoidoscopy):  Excessive amounts of blood in the stool  Significant tenderness or worsening of abdominal pains  Swelling of the abdomen that is new, acute  Fever of 100F or higher  FOLLOW UP: If any biopsies were taken you will be contacted by phone or by letter within the next 1-3 weeks.  Call your gastroenterologist if you have not heard about the biopsies in 3 weeks.  Our staff will call the home number listed on your records the next business day following your procedure to check on you and address any questions or concerns that you may have at that time regarding the information given to you following your procedure. This is a courtesy call and so if there is no answer at the home number and we have not heard from you through the emergency physician on call, we will assume that you have returned to your regular daily activities without incident.  SIGNATURES/CONFIDENTIALITY: You and/or  your care partner have signed paperwork which will be entered into your electronic medical record.  These signatures attest to the fact that that the information above on your After Visit Summary has been reviewed and is understood.  Full responsibility of the confidentiality of this discharge information lies with you and/or your care-partner.

## 2014-03-12 ENCOUNTER — Telehealth: Payer: Self-pay | Admitting: *Deleted

## 2014-03-12 NOTE — Telephone Encounter (Signed)
  Follow up Call-  Call back number 03/09/2014  Post procedure Call Back phone  # [phone]  Permission to leave phone message Yes     Patient questions:  Do you have a fever, pain , or abdominal swelling? no Pain Score  0 *  Have you tolerated food without any problems? yes  Have you been able to return to your normal activities? yes  Do you have any questions about your discharge instructions: Diet   no Medications  no Follow up visit  no  Do you have questions or concerns about your Care? no  Actions: * If pain score is 4 or above: No action needed, pain <4.

## 2014-06-26 ENCOUNTER — Other Ambulatory Visit: Payer: Self-pay | Admitting: Nurse Practitioner

## 2014-06-28 NOTE — Telephone Encounter (Signed)
Last ov 3/15. Uses mail order.

## 2014-09-07 ENCOUNTER — Other Ambulatory Visit: Payer: Self-pay | Admitting: Nurse Practitioner

## 2014-10-01 ENCOUNTER — Other Ambulatory Visit: Payer: Self-pay

## 2014-10-01 DIAGNOSIS — Z1231 Encounter for screening mammogram for malignant neoplasm of breast: Secondary | ICD-10-CM

## 2014-11-12 ENCOUNTER — Ambulatory Visit
Admission: RE | Admit: 2014-11-12 | Discharge: 2014-11-12 | Disposition: A | Discharge status: Home / Self Care | Payer: BLUE CROSS/BLUE SHIELD | Source: Ambulatory Visit

## 2014-11-12 DIAGNOSIS — Z1231 Encounter for screening mammogram for malignant neoplasm of breast: Secondary | ICD-10-CM

## 2014-11-26 ENCOUNTER — Encounter: Payer: Self-pay | Admitting: Nurse Practitioner

## 2014-11-26 ENCOUNTER — Ambulatory Visit (INDEPENDENT_AMBULATORY_CARE_PROVIDER_SITE_OTHER): Payer: BLUE CROSS/BLUE SHIELD | Admitting: Nurse Practitioner

## 2014-11-26 ENCOUNTER — Telehealth: Payer: Self-pay | Admitting: Nurse Practitioner

## 2014-11-26 VITALS — BP 127/66 | HR 72 | Temp 97.5°F | Ht 63.0 in | Wt 179.0 lb

## 2014-11-26 DIAGNOSIS — J0101 Acute recurrent maxillary sinusitis: Secondary | ICD-10-CM

## 2014-11-26 MED ORDER — AMOXICILLIN 875 MG PO TABS: 875.0000 mg | ORAL_TABLET | Freq: Two times a day (BID) | ORAL | Status: DC

## 2014-11-26 MED ORDER — CHLORPHEN-PE-ACETAMINOPHEN 4-10-325 MG PO TABS
1.0000 | ORAL_TABLET | ORAL | Status: DC
Start: 2014-11-26 — End: 2015-09-11

## 2014-11-26 NOTE — Progress Notes (Signed)
   Subjective:    Patient ID: Hannah Reese, female    DOB: Oct 20, 1961, 54 y.o.   MRN: [id number]  HPI Patient in today with c/o nasal congestion and left ear pain- started several days ago- denies fever and sore throat. No OTC meds.    Review of Systems  Constitutional: Negative for fever and chills.  HENT: Positive for congestion, ear pain and sinus pressure.   Respiratory: Negative.   Cardiovascular: Negative.   Genitourinary: Negative.   Neurological: Negative.   Psychiatric/Behavioral: Negative.   All other systems reviewed and are negative.      Objective:   Physical Exam  Constitutional: She is oriented to person, place, and time. She appears well-developed and well-nourished.  HENT:  Right Ear: Hearing, tympanic membrane, external ear and ear canal normal.  Left Ear: Hearing, external ear and ear canal normal. Tympanic membrane is erythematous. A middle ear effusion is present.  Nose: Mucosal edema and rhinorrhea present. Right sinus exhibits maxillary sinus tenderness. Right sinus exhibits no frontal sinus tenderness. Left sinus exhibits maxillary sinus tenderness. Left sinus exhibits no frontal sinus tenderness.  Mouth/Throat: Uvula is midline, oropharynx is clear and moist and mucous membranes are normal.  Eyes: Pupils are equal, round, and reactive to light.  Neck: Normal range of motion. Neck supple.  Cardiovascular: Normal rate, regular rhythm and normal heart sounds.   Pulmonary/Chest: Effort normal and breath sounds normal.  Abdominal: Soft. Bowel sounds are normal.  Neurological: She is alert and oriented to person, place, and time.  Skin: Skin is warm and dry.  Psychiatric: She has a normal mood and affect. Her behavior is normal. Judgment and thought content normal.   BP 127/66 mmHg  Pulse 72  Temp(Src) 97.5 F (36.4 C) (Oral)  Ht 5\' 3"  (1.6 m)  Wt 179 lb (81.194 kg)  BMI 31.72 kg/m2        Assessment & Plan:  1. Acute recurrent maxillary  sinusitis 1. Take meds as prescribed 2. Use a cool mist humidifier especially during the winter months and when heat has been humid. 3. Use saline nose sprays frequently 4. Saline irrigations of the nose can be very helpful if done frequently.  * 4X daily for 1 week*  * Use of a nettie pot can be helpful with this. Follow directions with this* 5. Drink plenty of fluids 6. Keep thermostat turn down low 7.For any cough or congestion  Use plain Mucinex- regular strength or max strength is fine   * Children- consult with Pharmacist for dosing 8. For fever or aces or pains- take tylenol or ibuprofen appropriate for age and weight.  * for fevers greater than 101 orally you may alternate ibuprofen and tylenol every  3 hours.    - amoxicillin (AMOXIL) 875 MG tablet; Take 1 tablet (875 mg total) by mouth 2 (two) times daily.  Dispense: 20 tablet; Refill: 0 - Chlorphen-PE-Acetaminophen 4-10-325 MG TABS; Take 1 tablet by mouth every 8 (eight) weeks.  Dispense: 20 tablet; Refill: 0  Mary-Margaret Daphine DeutscherMartin, FNP

## 2014-11-26 NOTE — Patient Instructions (Signed)

## 2014-11-26 NOTE — Telephone Encounter (Signed)
Appointment given for today with Mary Martin, FNP. 

## 2015-02-06 ENCOUNTER — Encounter: Payer: Self-pay | Admitting: Nurse Practitioner

## 2015-02-06 ENCOUNTER — Ambulatory Visit (INDEPENDENT_AMBULATORY_CARE_PROVIDER_SITE_OTHER): Payer: BLUE CROSS/BLUE SHIELD | Admitting: Nurse Practitioner

## 2015-02-06 VITALS — BP 105/64 | HR 72 | Temp 97.4°F | Ht 63.0 in | Wt 171.0 lb

## 2015-02-06 DIAGNOSIS — K219 Gastro-esophageal reflux disease without esophagitis: Secondary | ICD-10-CM | POA: Diagnosis not present

## 2015-02-06 DIAGNOSIS — B009 Herpesviral infection, unspecified: Secondary | ICD-10-CM | POA: Diagnosis not present

## 2015-02-06 DIAGNOSIS — Z0000 Encounter for general adult medical examination without abnormal findings: Secondary | ICD-10-CM

## 2015-02-06 MED ORDER — OMEPRAZOLE 40 MG PO CPDR: 40.0000 mg | DELAYED_RELEASE_CAPSULE | Freq: Every day | ORAL | Status: DC

## 2015-02-06 MED ORDER — VALACYCLOVIR HCL 1 G PO TABS: 1000.0000 mg | ORAL_TABLET | Freq: Every day | ORAL | Status: DC

## 2015-02-06 NOTE — Progress Notes (Signed)
Subjective:    Patient ID: Hannah Reese, female    DOB: 12/13/1960, 54 y.o.   MRN: [id number]  HPI Patient is here for annual physical and follow up of chronic problems and for medication refills.  The patient also reporting full sensation in left ear-she was seen for an infection and given antibiotics for this. No other complaints or concerns.   Urinary incontinence, stress Had bladder sling surgery- no issues since.   Menorrhagia with irregular cycle Post menopausal- had an ablation done. No issues since.  Takes black cohosh 540 mg caps for hot flashes- no side effects, working well for her.   Patient Active Problem List   Diagnosis Date Noted  . Urinary, incontinence, stress female 06/11/2011    Class: Chronic  . Menorrhagia with regular cycle 06/11/2011    Class: Chronic   Outpatient Encounter Prescriptions as of 02/06/2015  Medication Sig  . Black Cohosh 540 MG CAPS Take 1 capsule by mouth daily.  . Chlorphen-PE-Acetaminophen 4-10-325 MG TABS Take 1 tablet by mouth every 8 (eight) weeks.  . Cholecalciferol (VITAMIN D) 1000 UNITS capsule Take 1,000 Units by mouth daily.  . cyclobenzaprine (FLEXERIL) 5 MG tablet Take 1 tablet (5 mg total) by mouth 3 (three) times daily as needed for muscle spasms.  . Flaxseed, Linseed, (FLAXSEED OIL PO) Take 1,400 mg by mouth daily.  Marland Kitchen ibuprofen (ADVIL,MOTRIN) 800 MG tablet Take 1 tablet (800 mg total) by mouth every 8 (eight) hours as needed for pain.  Marland Kitchen omeprazole (PRILOSEC) 40 MG capsule TAKE 1 CAPSULE DAILY  . valACYclovir (VALTREX) 1000 MG tablet Take 0.5 tablets (500 mg total) by mouth daily.  . [DISCONTINUED] amoxicillin (AMOXIL) 875 MG tablet Take 1 tablet (875 mg total) by mouth 2 (two) times daily.       Review of Systems  Constitutional: Negative.  Negative for activity change, fatigue and unexpected weight change.  HENT: Negative for ear discharge and ear pain.   Respiratory: Negative.  Negative for shortness of breath.     Cardiovascular: Negative.  Negative for chest pain.  Allergic/Immunologic: Positive for environmental allergies (Takes OTC meds PRN).  Neurological: Negative.  Negative for dizziness.       Objective:   Physical Exam  Constitutional: She is oriented to person, place, and time. She appears well-developed and well-nourished.  HENT:  Nose: Nose normal.  Mouth/Throat: Oropharynx is clear and moist.  Bilateral TM intact, expected cone of light, BLM visible and intact.   Eyes: Conjunctivae are normal. Right eye exhibits no discharge. Left eye exhibits no discharge.  Neck: Neck supple.  Cardiovascular: Normal rate, regular rhythm and normal heart sounds.   Pulmonary/Chest: Effort normal and breath sounds normal. No respiratory distress.  Abdominal: Soft. Bowel sounds are normal.  Lymphadenopathy:    She has no cervical adenopathy.  Neurological: She is alert and oriented to person, place, and time.  Skin: Skin is warm and dry.  Psychiatric: She has a normal mood and affect. Her behavior is normal. Judgment and thought content normal.   BP 105/64 mmHg  Pulse 72  Temp(Src) 97.4 F (36.3 C) (Oral)  Ht _0  (1.6 m)  Wt 171 lb (77.565 kg)  BMI 30.30 kg/m2        Assessment & Plan:  1. Annual physical exam - CMP14+EGFR - NMR, lipoprofile  2. HSV-1 (herpes simplex virus 1) infection - valACYclovir (VALTREX) 1000 MG tablet; Take 1 tablet (1,000 mg total) by mouth daily.  Dispense: 90 tablet; Refill: 3  3. Gastroesophageal reflux disease without esophagitis Avoid spicy foods - omeprazole (PRILOSEC) 40 MG capsule; Take 1 capsule (40 mg total) by mouth daily.  Dispense: 90 capsule; Refill: 1    Labs pending Health maintenance reviewed Diet and exercise encouraged Continue all meds Follow up  In 6 month   Bunk Foss, FNP

## 2015-02-06 NOTE — Patient Instructions (Signed)
Exercise to Stay Healthy Exercise helps you become and stay healthy. EXERCISE IDEAS AND TIPS Choose exercises that:  You enjoy.  Fit into your day. You do not need to exercise really hard to be healthy. You can do exercises at a slow or medium level and stay healthy. You can:  Stretch before and after working out.  Try yoga, Pilates, or tai chi.  Lift weights.  Walk fast, swim, jog, run, climb stairs, bicycle, dance, or rollerskate.  Take aerobic classes. Exercises that burn about 150 calories:  Running 1  miles in 15 minutes.  Playing volleyball for 45 to 60 minutes.  Washing and waxing a car for 45 to 60 minutes.  Playing touch football for 45 minutes.  Walking 1  miles in 35 minutes.  Pushing a stroller 1  miles in 30 minutes.  Playing basketball for 30 minutes.  Raking leaves for 30 minutes.  Bicycling 5 miles in 30 minutes.  Walking 2 miles in 30 minutes.  Dancing for 30 minutes.  Shoveling snow for 15 minutes.  Swimming laps for 20 minutes.  Walking up stairs for 15 minutes.  Bicycling 4 miles in 15 minutes.  Gardening for 30 to 45 minutes.  Jumping rope for 15 minutes.  Washing windows or floors for 45 to 60 minutes. Document Released: 11/21/2010 Document Revised: 01/11/2012 Document Reviewed: 11/21/2010 Exit[employer] Patient Information 2015 [employer]. This information is not intended to replace advice given to you by your health care provider. Make sure you discuss any questions you have with your health care provider.  

## 2015-02-07 LAB — NMR, LIPOPROFILE
CHOLESTEROL: 220 mg/dL — AB (ref 100–199)
HDL Cholesterol by NMR: 90 mg/dL (ref >39)
HDL Particle Number: 47.2 umol/L (ref >=30.5)
LDL Particle Number: 979 nmol/L (ref <1000)
LDL Size: 21.3 nm (ref >20.5)
LDL-C: 122 mg/dL — AB (ref 0–99)
Small LDL Particle Number: 213 nmol/L (ref <=527)
Triglycerides by NMR: 39 mg/dL (ref 0–149)

## 2015-02-07 LAB — CMP14+EGFR
ALT: 9 IU/L (ref 0–32)
AST: 18 IU/L (ref 0–40)
Albumin/Globulin Ratio: 1.8 (ref 1.1–2.5)
Albumin: 4.7 g/dL (ref 3.5–5.5)
Alkaline Phosphatase: 85 IU/L (ref 39–117)
BUN / CREAT RATIO: 18 (ref 9–23)
BUN: 16 mg/dL (ref 6–24)
Bilirubin Total: 0.2 mg/dL (ref 0.0–1.2)
CALCIUM: 9.8 mg/dL (ref 8.7–10.2)
CO2: 25 mmol/L (ref 18–29)
Chloride: 99 mmol/L (ref 97–108)
Creatinine, Ser: 0.89 mg/dL (ref 0.57–1.00)
GFR, EST AFRICAN AMERICAN: 87 mL/min/{1.73_m2} (ref >59)
GFR, EST NON AFRICAN AMERICAN: 75 mL/min/{1.73_m2} (ref >59)
GLOBULIN, TOTAL: 2.6 g/dL (ref 1.5–4.5)
Glucose: 91 mg/dL (ref 65–99)
Potassium: 4.4 mmol/L (ref 3.5–5.2)
SODIUM: 142 mmol/L (ref 134–144)
TOTAL PROTEIN: 7.3 g/dL (ref 6.0–8.5)

## 2015-02-08 ENCOUNTER — Telehealth: Payer: Self-pay | Admitting: *Deleted

## 2015-02-08 NOTE — Telephone Encounter (Signed)
-----   Message from [facility] Martin, FNP sent at 02/08/2015  9:52 AM EDT ----- Kidney and liver function stable ldl particle numbers look good- ldl ar eup slightly Continue current meds- low fat diet and exercise and recheck in 3 months

## 2015-07-17 ENCOUNTER — Other Ambulatory Visit: Payer: Self-pay | Admitting: Nurse Practitioner

## 2015-09-06 ENCOUNTER — Other Ambulatory Visit: Payer: BLUE CROSS/BLUE SHIELD

## 2015-09-06 DIAGNOSIS — Z0000 Encounter for general adult medical examination without abnormal findings: Secondary | ICD-10-CM

## 2015-09-06 NOTE — Progress Notes (Signed)
Lab only 

## 2015-09-07 LAB — CMP14+EGFR
ALT: 10 IU/L (ref 0–32)
AST: 20 IU/L (ref 0–40)
Albumin/Globulin Ratio: 2 (ref 1.1–2.5)
Albumin: 4.4 g/dL (ref 3.5–5.5)
Alkaline Phosphatase: 83 IU/L (ref 39–117)
BUN/Creatinine Ratio: 23 (ref 9–23)
BUN: 20 mg/dL (ref 6–24)
Bilirubin Total: 0.2 mg/dL (ref 0.0–1.2)
CALCIUM: 9.7 mg/dL (ref 8.7–10.2)
CO2: 26 mmol/L (ref 18–29)
Chloride: 102 mmol/L (ref 97–106)
Creatinine, Ser: 0.88 mg/dL (ref 0.57–1.00)
GFR, EST AFRICAN AMERICAN: 88 mL/min/{1.73_m2} (ref >59)
GFR, EST NON AFRICAN AMERICAN: 76 mL/min/{1.73_m2} (ref >59)
GLUCOSE: 87 mg/dL (ref 65–99)
Globulin, Total: 2.2 g/dL (ref 1.5–4.5)
Potassium: 5 mmol/L (ref 3.5–5.2)
Sodium: 142 mmol/L (ref 136–144)
Total Protein: 6.6 g/dL (ref 6.0–8.5)

## 2015-09-07 LAB — LIPID PANEL
CHOL/HDL RATIO: 2.8 ratio (ref 0.0–4.4)
Cholesterol, Total: 198 mg/dL (ref 100–199)
HDL: 72 mg/dL (ref >39)
LDL Calculated: 120 mg/dL — ABNORMAL HIGH (ref 0–99)
TRIGLYCERIDES: 32 mg/dL (ref 0–149)
VLDL Cholesterol Cal: 6 mg/dL (ref 5–40)

## 2015-09-11 ENCOUNTER — Ambulatory Visit (INDEPENDENT_AMBULATORY_CARE_PROVIDER_SITE_OTHER): Payer: BLUE CROSS/BLUE SHIELD | Admitting: Nurse Practitioner

## 2015-09-11 ENCOUNTER — Encounter: Payer: Self-pay | Admitting: Nurse Practitioner

## 2015-09-11 VITALS — BP 99/64 | HR 60 | Temp 97.5°F | Ht 63.0 in | Wt 174.0 lb

## 2015-09-11 DIAGNOSIS — K219 Gastro-esophageal reflux disease without esophagitis: Secondary | ICD-10-CM

## 2015-09-11 DIAGNOSIS — Z6830 Body mass index (BMI) 30.0-30.9, adult: Secondary | ICD-10-CM

## 2015-09-11 DIAGNOSIS — Z0000 Encounter for general adult medical examination without abnormal findings: Secondary | ICD-10-CM

## 2015-09-11 MED ORDER — PHENTERMINE HCL 37.5 MG PO TABS: 37.5000 mg | ORAL_TABLET | Freq: Every day | ORAL | Status: DC

## 2015-09-11 NOTE — Patient Instructions (Signed)

## 2015-09-11 NOTE — Progress Notes (Signed)
   Subjective:    Patient ID: Hannah Reese, female    DOB: [birth year], 54 y.o.   MRN: [id number]  HPI: Pt here today for physical without a pap. No complaints today. States she had a pap last year with her OB-GYN    Review of Systems  Constitutional: Negative.   HENT: Negative.   Respiratory: Negative.   Cardiovascular: Negative.   Gastrointestinal: Negative.   Genitourinary: Negative.   Musculoskeletal: Negative.   Skin: Negative.   Neurological: Negative.        Objective:   Physical Exam  Constitutional: She is oriented to person, place, and time. She appears well-developed and well-nourished.  HENT:  Head: Normocephalic.  Eyes: Conjunctivae are normal. Pupils are equal, round, and reactive to light.  Neck: Normal range of motion.  Cardiovascular: Normal rate, regular rhythm, normal heart sounds and intact distal pulses.   Pulmonary/Chest: Effort normal and breath sounds normal.  Abdominal: Soft. Bowel sounds are normal.  Musculoskeletal: Normal range of motion.  Neurological: She is alert and oriented to person, place, and time. She has normal reflexes.  Skin: Skin is warm and dry.    BP 99/64 mmHg  Pulse 60  Temp(Src) 97.5 F (36.4 C) (Oral)  Ht 5\' 3"  (1.6 m)  Wt 174 lb (78.926 kg)  BMI 30.83 kg/m2     Assessment & Plan:  1. Annual physical exam Labs completed prior to visit  2. Gastroesophageal reflux disease without esophagitis Avoid spicy foods and eating 2 hours prior to bedtime.   3. Hyperlipidemia Reviewed labs at appointment Refuses statin- wants to try diet  4. bmi 30.0-30.9 Discussed diet and exercise for person with BMI >25 Will recheck weight in 3-6 months Meds ordered this encounter  Medications  . phentermine (ADIPEX-P) 37.5 MG tablet    Sig: Take 1 tablet (37.5 mg total) by mouth daily before breakfast.    Dispense:  30 tablet    Refill:  2    Order Specific Question:  Supervising Provider    Answer:  Deborra MedinaMOORE, DONALD W [[id number]]      Continue all meds Labs pending Health Maintenance reviewed Diet and exercise encouraged RTO 6 months  Mary-Margaret Daphine DeutscherMartin, FNP

## 2015-09-12 LAB — AMENORRHEA PROFILE
FSH: 100.3 m[IU]/mL
LH: 40.4 m[IU]/mL
PROLACTIN: 11.1 ng/mL (ref 4.8–23.3)

## 2015-09-12 LAB — SPECIMEN STATUS REPORT

## 2015-10-15 ENCOUNTER — Other Ambulatory Visit: Payer: Self-pay | Admitting: Nurse Practitioner

## 2015-11-06 ENCOUNTER — Other Ambulatory Visit: Payer: Self-pay

## 2015-11-06 DIAGNOSIS — Z1231 Encounter for screening mammogram for malignant neoplasm of breast: Secondary | ICD-10-CM

## 2015-11-14 ENCOUNTER — Ambulatory Visit
Admission: RE | Admit: 2015-11-14 | Discharge: 2015-11-14 | Disposition: A | Discharge status: Home / Self Care | Payer: BLUE CROSS/BLUE SHIELD | Source: Ambulatory Visit

## 2015-11-14 DIAGNOSIS — Z1231 Encounter for screening mammogram for malignant neoplasm of breast: Secondary | ICD-10-CM

## 2015-12-24 ENCOUNTER — Ambulatory Visit (INDEPENDENT_AMBULATORY_CARE_PROVIDER_SITE_OTHER): Payer: BLUE CROSS/BLUE SHIELD | Admitting: Nurse Practitioner

## 2015-12-24 ENCOUNTER — Encounter: Payer: Self-pay | Admitting: Nurse Practitioner

## 2015-12-24 VITALS — BP 114/70 | HR 73 | Temp 97.7°F | Ht 63.0 in | Wt 164.0 lb

## 2015-12-24 DIAGNOSIS — B009 Herpesviral infection, unspecified: Secondary | ICD-10-CM

## 2015-12-24 DIAGNOSIS — Z6829 Body mass index (BMI) 29.0-29.9, adult: Secondary | ICD-10-CM

## 2015-12-24 DIAGNOSIS — K219 Gastro-esophageal reflux disease without esophagitis: Secondary | ICD-10-CM

## 2015-12-24 MED ORDER — NALTREXONE-BUPROPION HCL ER 8-90 MG PO TB12: ORAL_TABLET | ORAL | Status: DC

## 2015-12-24 NOTE — Progress Notes (Signed)
  Subjective:    Patient ID: Hannah Reese, female    DOB: 01-Jan-1961, 55 y.o.   MRN: [id number]  HPI  Patient here today for follow up of chronic medical problems.  Outpatient Encounter Prescriptions as of 12/24/2015  Medication Sig  . Black Cohosh 540 MG CAPS Take 1 capsule by mouth daily.  . Cholecalciferol (VITAMIN D) 1000 UNITS capsule Take 1,000 Units by mouth daily.  . cyclobenzaprine (FLEXERIL) 5 MG tablet Take 1 tablet (5 mg total) by mouth 3 (three) times daily as needed for muscle spasms.  Marland Kitchen ibuprofen (ADVIL,MOTRIN) 800 MG tablet Take 1 tablet (800 mg total) by mouth every 8 (eight) hours as needed for pain.  Marland Kitchen omeprazole (PRILOSEC) 40 MG capsule Take 1 capsule (40 mg total) by mouth daily.  . phentermine (ADIPEX-P) 37.5 MG tablet Take 1 tablet (37.5 mg total) by mouth daily before breakfast.  . valACYclovir (VALTREX) 1000 MG tablet Take 1 tablet (1,000 mg total) by mouth daily.  . [DISCONTINUED] Flaxseed, Linseed, (FLAXSEED OIL PO) Take 1,400 mg by mouth daily.   No facility-administered encounter medications on file as of 12/24/2015.    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 Patient takes Valtrex 1g daily. No recent outbreaks GERD This is a chronic problem. She is currently on Omperazole which works very well for her. depresion She is currently not on anything- has tried meds in the past but always stop taking when she feels better. BMI 29 Was given adipex for a couple of months and lost 10lbs. WOuld like to try something else.     Review of Systems  Constitutional: Negative.   HENT: Negative.   Eyes: Negative.   Respiratory: Negative.   Cardiovascular: Negative.   Gastrointestinal: Negative.   Genitourinary: Negative.   Musculoskeletal: Positive for back pain.  Allergic/Immunologic: Negative.   Neurological: Negative.  Negative for numbness.  Hematological: Negative.   Psychiatric/Behavioral: Negative.        Objective:   Physical Exam  Constitutional:  She is oriented to person, place, and time. She appears well-developed and well-nourished.  Cardiovascular: Normal rate, normal heart sounds and intact distal pulses.   No murmur heard. Pulmonary/Chest: Effort normal and breath sounds normal.  Abdominal: Soft. Bowel sounds are normal.  Musculoskeletal: Normal range of motion.  Neurological: She is alert and oriented to person, place, and time.  Skin: Skin is warm and dry.  Psychiatric: She has a normal mood and affect. Her behavior is normal. Judgment and thought content normal.    BP 114/70 mmHg  Pulse 73  Temp(Src) 97.7 F (36.5 C) (Oral)  Ht  (1.6 m)  Wt 164 lb (74.39 kg)  BMI 29.06 kg/m2       Assessment & Plan:  1. Gastroesophageal reflux disease without esophagitis Avoid spicy foods Do not eat 2 hours prior to bedtime  2. HSV-1 (herpes simplex virus 1) infection Safe sex encouraged  3. BMI 29.0-29.9,adult Discussed diet and exercise for person with BMI >25 Will recheck weight in 3-6 months - Naltrexone-Bupropion HCl ER 8-90 MG TB12; 1 tab po Qam x 1 week, then 1 tab po BID x1 week, then2 tabs po qam and 1 tab po qpm x1 week then 2 po BID  Dispense: 120 tablet; Refill: 2   Mary-Margaret Daphine Deutscher, FNP

## 2015-12-24 NOTE — Patient Instructions (Signed)

## 2016-03-24 ENCOUNTER — Ambulatory Visit (INDEPENDENT_AMBULATORY_CARE_PROVIDER_SITE_OTHER): Payer: BLUE CROSS/BLUE SHIELD | Admitting: Nurse Practitioner

## 2016-03-24 ENCOUNTER — Encounter: Payer: Self-pay | Admitting: Nurse Practitioner

## 2016-03-24 VITALS — BP 115/72 | HR 67 | Temp 97.6°F | Ht 63.0 in | Wt 157.0 lb

## 2016-03-24 DIAGNOSIS — M5441 Lumbago with sciatica, right side: Secondary | ICD-10-CM | POA: Diagnosis not present

## 2016-03-24 DIAGNOSIS — M5442 Lumbago with sciatica, left side: Secondary | ICD-10-CM

## 2016-03-24 MED ORDER — PREDNISONE 10 MG (21) PO TBPK: 10.0000 mg | ORAL_TABLET | Freq: Every day | ORAL | Status: DC

## 2016-03-24 MED ORDER — CYCLOBENZAPRINE HCL 10 MG PO TABS: 10.0000 mg | ORAL_TABLET | Freq: Three times a day (TID) | ORAL | Status: DC | PRN

## 2016-03-24 NOTE — Patient Instructions (Signed)

## 2016-03-24 NOTE — Progress Notes (Signed)
   Subjective:    Patient ID: Hannah Reese, female    DOB: 1960/11/18, 55 y.o.   MRN: [id number]  Back Pain This is a recurrent problem. The current episode started more than 1 year ago. The problem occurs intermittently. The problem has been gradually worsening since onset. The pain is present in the lumbar spine. The quality of the pain is described as aching. Radiates to: deown back of right leg. The pain is at a severity of 7/10. The pain is moderate. The pain is the same all the time. The symptoms are aggravated by sitting, twisting and standing. Pertinent negatives include no abdominal pain, bladder incontinence, bowel incontinence, dysuria, tingling or weakness. She has tried NSAIDs for the symptoms. The treatment provided mild relief.      Review of Systems  Constitutional: Negative.   HENT: Negative.   Gastrointestinal: Negative for abdominal pain and bowel incontinence.  Genitourinary: Negative for bladder incontinence and dysuria.  Musculoskeletal: Positive for back pain.  Neurological: Negative for tingling and weakness.  Psychiatric/Behavioral: Negative.   All other systems reviewed and are negative.      Objective:   Physical Exam  Constitutional: She is oriented to person, place, and time. She appears well-developed and well-nourished. No distress.  Cardiovascular: Normal rate, regular rhythm and normal heart sounds.   Pulmonary/Chest: Effort normal and breath sounds normal.  Musculoskeletal:  FROM of lumbar spine with poain on extension and rotation to right No point tenderness (-) SLR bil Motor strength and sensation distally intact  Neurological: She is alert and oriented to person, place, and time.  Skin: Skin is warm and dry.  Psychiatric: She has a normal mood and affect. Her behavior is normal. Judgment and thought content normal.    BP 115/72 mmHg  Pulse 67  Temp(Src) 97.6 F (36.4 C) (Oral)  Ht 5\' 3"  (1.6 m)  Wt 157 lb (71.215 kg)  BMI 27.82  kg/m2       Assessment & Plan:   1. Bilateral low back pain with sciatica, sciatica laterality unspecified    Meds ordered this encounter  Medications  . predniSONE (STERAPRED UNI-PAK 21 TAB) 10 MG (21) TBPK tablet    Sig: Take 1 tablet (10 mg total) by mouth daily. As directed x 6 days    Dispense:  21 tablet    Refill:  0    Order Specific Question:  Supervising Provider    Answer:  Ernestina PennaMOORE, DONALD W [[id number]]  . cyclobenzaprine (FLEXERIL) 10 MG tablet    Sig: Take 1 tablet (10 mg total) by mouth 3 (three) times daily as needed for muscle spasms.    Dispense:  30 tablet    Refill:  1    Order Specific Question:  Supervising Provider    Answer:  Ernestina PennaMOORE, DONALD W [[id number]]   Moist heat Rest No heavy lifting RTO prn  Mary-Margaret Daphine DeutscherMartin, FNP

## 2016-04-12 ENCOUNTER — Other Ambulatory Visit: Payer: Self-pay | Admitting: Nurse Practitioner

## 2016-05-08 ENCOUNTER — Telehealth: Payer: Self-pay | Admitting: Nurse Practitioner

## 2016-05-08 NOTE — Telephone Encounter (Signed)
Pt has skin lesion on chest appt scheduled

## 2016-05-12 ENCOUNTER — Ambulatory Visit (INDEPENDENT_AMBULATORY_CARE_PROVIDER_SITE_OTHER): Payer: BLUE CROSS/BLUE SHIELD | Admitting: Nurse Practitioner

## 2016-05-12 ENCOUNTER — Encounter: Payer: Self-pay | Admitting: Nurse Practitioner

## 2016-05-12 VITALS — BP 114/70 | HR 63 | Temp 97.5°F | Ht 63.0 in | Wt 153.0 lb

## 2016-05-12 DIAGNOSIS — L928 Other granulomatous disorders of the skin and subcutaneous tissue: Secondary | ICD-10-CM | POA: Diagnosis not present

## 2016-05-12 DIAGNOSIS — L989 Disorder of the skin and subcutaneous tissue, unspecified: Secondary | ICD-10-CM

## 2016-05-12 DIAGNOSIS — Z6829 Body mass index (BMI) 29.0-29.9, adult: Secondary | ICD-10-CM

## 2016-05-12 MED ORDER — NALTREXONE-BUPROPION HCL ER 8-90 MG PO TB12: ORAL_TABLET | ORAL | Status: DC

## 2016-05-12 NOTE — Progress Notes (Addendum)
   Subjective:    Patient ID: Hannah Reese, female    DOB: 1961-01-23, 55 y.o.   MRN: [id number]  HPI  Patient comes in c/o skin lesion on anterior chest- rough feeling- no change in years.   Review of Systems  Constitutional: Negative.   Respiratory: Negative.   Cardiovascular: Negative.   Genitourinary: Negative.   Neurological: Negative.   Psychiatric/Behavioral: Negative.   All other systems reviewed and are negative.      Objective:   Physical Exam  Constitutional: She is oriented to person, place, and time. She appears well-developed and well-nourished.  Cardiovascular: Normal rate, regular rhythm and normal heart sounds.   Pulmonary/Chest: Effort normal and breath sounds normal.  Neurological: She is alert and oriented to person, place, and time.  Skin:  Slightly raised flesh colored skin lesion left anterior chest wall.  Psychiatric: She has a normal mood and affect. Her behavior is normal. Judgment and thought content normal.   BP 114/70 mmHg  Pulse 63  Temp(Src) 97.5 F (36.4 C) (Oral)  Ht 5\' 3"  (1.6 m)  Wt 153 lb (40.969.4 kg)  BMI 27.11 kg/m2  Cryotherapy to left anterior chest wall      Assessment & Plan:   1. Skin lesion of chest wall   2. 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 BMI 29.0-29.9,adult      Cryotherapy care discussed RTO prn  Mary-Margaret Daphine DeutscherMartin, FNP

## 2016-06-10 ENCOUNTER — Other Ambulatory Visit: Payer: Self-pay | Admitting: Nurse Practitioner

## 2016-06-10 DIAGNOSIS — B009 Herpesviral infection, unspecified: Secondary | ICD-10-CM

## 2016-09-09 ENCOUNTER — Other Ambulatory Visit: Payer: Self-pay | Admitting: Nurse Practitioner

## 2016-09-09 DIAGNOSIS — B009 Herpesviral infection, unspecified: Secondary | ICD-10-CM

## 2016-10-14 ENCOUNTER — Other Ambulatory Visit: Payer: Self-pay | Admitting: Nurse Practitioner

## 2016-10-16 ENCOUNTER — Other Ambulatory Visit: Payer: Self-pay | Admitting: Obstetrics & Gynecology

## 2016-10-16 DIAGNOSIS — Z1231 Encounter for screening mammogram for malignant neoplasm of breast: Secondary | ICD-10-CM

## 2016-11-16 ENCOUNTER — Ambulatory Visit
Admission: RE | Admit: 2016-11-16 | Discharge: 2016-11-16 | Disposition: A | Discharge status: Home / Self Care | Payer: BLUE CROSS/BLUE SHIELD | Source: Ambulatory Visit | Attending: Obstetrics & Gynecology | Admitting: Obstetrics & Gynecology

## 2016-11-16 DIAGNOSIS — Z1231 Encounter for screening mammogram for malignant neoplasm of breast: Secondary | ICD-10-CM

## 2016-11-26 DIAGNOSIS — Z6828 Body mass index (BMI) 28.0-28.9, adult: Secondary | ICD-10-CM | POA: Diagnosis not present

## 2016-11-26 DIAGNOSIS — Z01419 Encounter for gynecological examination (general) (routine) without abnormal findings: Secondary | ICD-10-CM | POA: Diagnosis not present

## 2017-01-12 ENCOUNTER — Ambulatory Visit (INDEPENDENT_AMBULATORY_CARE_PROVIDER_SITE_OTHER): Payer: BLUE CROSS/BLUE SHIELD | Admitting: Nurse Practitioner

## 2017-01-12 ENCOUNTER — Encounter: Payer: Self-pay | Admitting: Nurse Practitioner

## 2017-01-12 ENCOUNTER — Other Ambulatory Visit: Payer: Self-pay | Admitting: Nurse Practitioner

## 2017-01-12 VITALS — BP 107/64 | HR 71 | Temp 97.5°F | Ht 63.0 in | Wt 168.0 lb

## 2017-01-12 DIAGNOSIS — Z6829 Body mass index (BMI) 29.0-29.9, adult: Secondary | ICD-10-CM

## 2017-01-12 DIAGNOSIS — K219 Gastro-esophageal reflux disease without esophagitis: Secondary | ICD-10-CM | POA: Diagnosis not present

## 2017-01-12 DIAGNOSIS — B009 Herpesviral infection, unspecified: Secondary | ICD-10-CM

## 2017-01-12 MED ORDER — VALACYCLOVIR HCL 1 G PO TABS: 1000.0000 mg | ORAL_TABLET | Freq: Every day | ORAL | 1 refills | Status: DC

## 2017-01-12 MED ORDER — OMEPRAZOLE 40 MG PO CPDR: 40.0000 mg | DELAYED_RELEASE_CAPSULE | Freq: Every day | ORAL | 1 refills | Status: DC

## 2017-01-12 MED ORDER — NALTREXONE-BUPROPION HCL ER 8-90 MG PO TB12: ORAL_TABLET | ORAL | 0 refills | Status: DC

## 2017-01-12 NOTE — Patient Instructions (Signed)

## 2017-01-12 NOTE — Progress Notes (Signed)
  Subjective:    Patient ID: Hannah Reese, female    DOB: [birth year], 56 y.o.   MRN: [id number]  HPI  Patient here today for follow up of chronic medical problems.  Outpatient Encounter Prescriptions as of 01/12/2017  Medication Sig  . Black Cohosh 540 MG CAPS Take 1 capsule by mouth daily.  . Cholecalciferol (VITAMIN D) 1000 UNITS capsule Take 1,000 Units by mouth daily.  . cyclobenzaprine (FLEXERIL) 5 MG tablet Take 1 tablet (5 mg total) by mouth 3 (three) times daily as needed for muscle spasms.  Marland Kitchen. ibuprofen (ADVIL,MOTRIN) 800 MG tablet Take 1 tablet (800 mg total) by mouth every 8 (eight) hours as needed for pain.  . Naltrexone-Bupropion HCl ER 8-90 MG TB12 1 tab po Qam x 1 week, then 1 tab po BID x1 week, then2 tabs po qam and 1 tab po qpm x1 week then 2 po BID  . omeprazole (PRILOSEC) 40 MG capsule TAKE 1 CAPSULE DAILY  . valACYclovir (VALTREX) 1000 MG tablet TAKE 1 TABLET DAILY   No facility-administered encounter medications on file as of 01/12/2017.     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 Patient takes Valtrex 1g daily. No recent outbreaks GERD This is a chronic problem. She is currently on Omperazole which works very well for her. depresion She is currently not on anything- has tried meds in the past but always stops taking when she feels better. BMI 29 Contrave given to aid in weight loss.      Review of Systems  Constitutional: Negative.   HENT: Negative.   Eyes: Negative.   Respiratory: Negative.   Cardiovascular: Negative.   Gastrointestinal: Negative.   Genitourinary: Negative.   Musculoskeletal: Positive for back pain.  Allergic/Immunologic: Negative.   Neurological: Negative.  Negative for numbness.  Hematological: Negative.   Psychiatric/Behavioral: Negative.        Objective:   Physical Exam  Constitutional: She is oriented to person, place, and time. She appears well-developed and well-nourished.  Cardiovascular: Normal rate, normal heart  sounds and intact distal pulses.   No murmur heard. Pulmonary/Chest: Effort normal and breath sounds normal.  Abdominal: Soft. Bowel sounds are normal.  Musculoskeletal: Normal range of motion.  Neurological: She is alert and oriented to person, place, and time.  Skin: Skin is warm and dry.  Psychiatric: She has a normal mood and affect. Her behavior is normal. Judgment and thought content normal.    BP 107/64   Pulse 71   Temp 97.5 F (36.4 C) (Oral)   Ht 5\' 3"  (1.6 m)   Wt 168 lb (76.2 kg)   BMI 29.76 kg/m     Assessment & Plan:  1. BMI 29.0-29.9,adult Balanced diet and daily exercise encouraged - Naltrexone-Bupropion HCl ER 8-90 MG TB12; 1 tab po Qam x 1 week, then 1 tab po BID x1 week, then2 tabs po qam and 1 tab po qpm x1 week then 2 po BID  Dispense: 120 tablet; Refill: 0  2. HSV-1 (herpes simplex virus 1) infection Take medication as prescribed - valACYclovir (VALTREX) 1000 MG tablet; Take 1 tablet (1,000 mg total) by mouth daily.  Dispense: 90 tablet; Refill: 1  3. Gastroesophageal reflux disease without esophagitis Avoid spicy foods Do not eat 2 hours prior to bedtime - omeprazole (PRILOSEC) 40 MG capsule; Take 1 capsule (40 mg total) by mouth daily.  Dispense: 90 capsule; Refill: 1  Mary-Margaret Daphine DeutscherMartin, FNP

## 2017-07-11 ENCOUNTER — Other Ambulatory Visit: Payer: Self-pay | Admitting: Nurse Practitioner

## 2017-07-11 DIAGNOSIS — K219 Gastro-esophageal reflux disease without esophagitis: Secondary | ICD-10-CM

## 2017-07-15 ENCOUNTER — Ambulatory Visit: Payer: BLUE CROSS/BLUE SHIELD | Admitting: Nurse Practitioner

## 2017-07-20 ENCOUNTER — Encounter: Payer: Self-pay | Admitting: Nurse Practitioner

## 2017-07-20 ENCOUNTER — Ambulatory Visit (INDEPENDENT_AMBULATORY_CARE_PROVIDER_SITE_OTHER): Payer: BLUE CROSS/BLUE SHIELD | Admitting: Nurse Practitioner

## 2017-07-20 VITALS — BP 121/74 | HR 61 | Temp 97.6°F | Ht 63.0 in | Wt 170.0 lb

## 2017-07-20 DIAGNOSIS — Z6829 Body mass index (BMI) 29.0-29.9, adult: Secondary | ICD-10-CM

## 2017-07-20 DIAGNOSIS — K219 Gastro-esophageal reflux disease without esophagitis: Secondary | ICD-10-CM | POA: Diagnosis not present

## 2017-07-20 DIAGNOSIS — B009 Herpesviral infection, unspecified: Secondary | ICD-10-CM | POA: Diagnosis not present

## 2017-07-20 MED ORDER — OMEPRAZOLE 40 MG PO CPDR: 40.0000 mg | DELAYED_RELEASE_CAPSULE | Freq: Every day | ORAL | 1 refills | Status: DC

## 2017-07-20 MED ORDER — NALTREXONE-BUPROPION HCL ER 8-90 MG PO TB12: ORAL_TABLET | ORAL | 0 refills | Status: DC

## 2017-07-20 MED ORDER — VALACYCLOVIR HCL 1 G PO TABS
1000.0000 mg | ORAL_TABLET | Freq: Every day | ORAL | 1 refills | Status: DC
Start: 2017-07-20 — End: 2018-03-14

## 2017-07-20 NOTE — Patient Instructions (Signed)

## 2017-07-20 NOTE — Progress Notes (Signed)
Subjective:    Patient ID: Hannah Reese, female    DOB: 1961/03/13, 56 y.o.   MRN: [id number]  HPI SIRENITY SHEW is here today for follow up of chronic medical problem.  Outpatient Encounter Prescriptions as of 07/20/2017  Medication Sig  . Black Cohosh 540 MG CAPS Take 1 capsule by mouth daily.  . Cholecalciferol (VITAMIN D) 1000 UNITS capsule Take 1,000 Units by mouth daily.  . cyclobenzaprine (FLEXERIL) 5 MG tablet Take 1 tablet (5 mg total) by mouth 3 (three) times daily as needed for muscle spasms.  Marland Kitchen ibuprofen (ADVIL,MOTRIN) 800 MG tablet Take 1 tablet (800 mg total) by mouth every 8 (eight) hours as needed for pain.  . Naltrexone-Bupropion HCl ER 8-90 MG TB12 1 tab po Qam x 1 week, then 1 tab po BID x1 week, then2 tabs po qam and 1 tab po qpm x1 week then 2 po BID  . omeprazole (PRILOSEC) 40 MG capsule TAKE 1 CAPSULE DAILY  . valACYclovir (VALTREX) 1000 MG tablet Take 1 tablet (1,000 mg total) by mouth daily.   No facility-administered encounter medications on file as of 07/20/2017.     1. Gastroesophageal reflux disease without esophagitis  Patient managing symptoms with omeprazole and having no problems currently.  2. HSV-1 (herpes simplex virus 1) infection  Patient takes 1 g Valtrex daily for suppressive therapy.  3. BMI 29.0-29.9,adult  No significant weight gain or loss.    New complaints: Patient has a small lesion on the left foot that is not painful. She also wants to go back on contrave for weight loss.    Social history: Lives with boyfriend   Review of Systems  Constitutional: Negative for activity change, appetite change and fatigue.  Respiratory: Negative for cough and shortness of breath.   Cardiovascular: Negative for chest pain and palpitations.  Neurological: Negative for dizziness, light-headedness and headaches.  All other systems reviewed and are negative.      Objective:   Physical Exam  Constitutional: She is oriented to person, place,  and time. She appears well-developed and well-nourished. No distress.  HENT:  Head: Normocephalic.  Right Ear: External ear normal.  Left Ear: External ear normal.  Mouth/Throat: Oropharynx is clear and moist.  Eyes: Pupils are equal, round, and reactive to light.  Neck: Normal range of motion. Neck supple. No thyromegaly present.  Cardiovascular: Normal rate, regular rhythm, normal heart sounds and intact distal pulses.   No murmur heard. Pulmonary/Chest: Effort normal and breath sounds normal. No respiratory distress. She has no wheezes.  Abdominal: Soft. Bowel sounds are normal. She exhibits no distension. There is no tenderness.  Musculoskeletal: Normal range of motion.  Lymphadenopathy:    She has no cervical adenopathy.  Neurological: She is alert and oriented to person, place, and time.  Skin: Skin is warm and dry.  Psychiatric: She has a normal mood and affect. Her behavior is normal.   BP 121/74 (BP Location: Left Arm)   Pulse 61   Temp 97.6 F (36.4 C) (Oral)   Ht '5\' 3"'$  (1.6 m)   Wt 170 lb (77.1 kg)   BMI 30.11 kg/m     Assessment & Plan:  1. Gastroesophageal reflux disease without esophagitis Avoid spicy foods Do not eat 2 hours prior to bedtime - omeprazole (PRILOSEC) 40 MG capsule; Take 1 capsule (40 mg total) by mouth daily.  Dispense: 90 capsule; Refill: 1 - CMP14+EGFR - Lipid panel  2. HSV-1 (herpes simplex virus 1) infection - valACYclovir (  VALTREX) 1000 MG tablet; Take 1 tablet (1,000 mg total) by mouth daily.  Dispense: 90 tablet; Refill: 1  3. BMI 29.0-29.9,adult Discussed diet and exercise for person with BMI >25 Will recheck weight in 3-6 months - Naltrexone-Bupropion HCl ER 8-90 MG TB12; 1 tab po Qam x 1 week, then 1 tab po BID x1 week, then2 tabs po qam and 1 tab po qpm x1 week then 2 po BID  Dispense: 120 tablet; Refill: 0    Labs pending Health maintenance reviewed Diet and exercise encouraged Continue all meds Follow up  In 6 months    [place], FNP

## 2017-07-21 LAB — CMP14+EGFR
A/G RATIO: 2 (ref 1.2–2.2)
ALK PHOS: 75 IU/L (ref 39–117)
ALT: 13 IU/L (ref 0–32)
AST: 19 IU/L (ref 0–40)
Albumin: 4.7 g/dL (ref 3.5–5.5)
BUN/Creatinine Ratio: 21 (ref 9–23)
BUN: 19 mg/dL (ref 6–24)
Bilirubin Total: <0.2 mg/dL (ref 0.0–1.2)
CO2: 25 mmol/L (ref 20–29)
CREATININE: 0.9 mg/dL (ref 0.57–1.00)
Calcium: 9.8 mg/dL (ref 8.7–10.2)
Chloride: 99 mmol/L (ref 96–106)
GFR calc Af Amer: 84 mL/min/{1.73_m2} (ref >59)
GFR calc non Af Amer: 73 mL/min/{1.73_m2} (ref >59)
GLOBULIN, TOTAL: 2.4 g/dL (ref 1.5–4.5)
Glucose: 94 mg/dL (ref 65–99)
POTASSIUM: 4.3 mmol/L (ref 3.5–5.2)
SODIUM: 141 mmol/L (ref 134–144)
Total Protein: 7.1 g/dL (ref 6.0–8.5)

## 2017-07-21 LAB — LIPID PANEL
CHOLESTEROL TOTAL: 215 mg/dL — AB (ref 100–199)
Chol/HDL Ratio: 2.9 ratio (ref 0.0–4.4)
HDL: 75 mg/dL (ref >39)
LDL CALC: 126 mg/dL — AB (ref 0–99)
TRIGLYCERIDES: 71 mg/dL (ref 0–149)
VLDL Cholesterol Cal: 14 mg/dL (ref 5–40)

## 2017-08-17 ENCOUNTER — Other Ambulatory Visit: Payer: Self-pay | Admitting: Nurse Practitioner

## 2017-08-17 DIAGNOSIS — Z6829 Body mass index (BMI) 29.0-29.9, adult: Secondary | ICD-10-CM

## 2017-10-19 ENCOUNTER — Other Ambulatory Visit: Payer: Self-pay | Admitting: Obstetrics & Gynecology

## 2017-10-19 DIAGNOSIS — Z1231 Encounter for screening mammogram for malignant neoplasm of breast: Secondary | ICD-10-CM

## 2017-11-06 ENCOUNTER — Other Ambulatory Visit: Payer: Self-pay | Admitting: Nurse Practitioner

## 2017-11-06 DIAGNOSIS — Z6829 Body mass index (BMI) 29.0-29.9, adult: Secondary | ICD-10-CM

## 2017-11-11 ENCOUNTER — Telehealth: Payer: Self-pay

## 2017-11-11 NOTE — Telephone Encounter (Signed)
See if can get coupon on line. [email or web address]

## 2017-11-11 NOTE — Telephone Encounter (Signed)
Contrave is denied by insurance  It is a plan exclusion

## 2017-11-11 NOTE — Telephone Encounter (Signed)
Pt is aware.  

## 2017-11-22 ENCOUNTER — Ambulatory Visit
Admission: RE | Admit: 2017-11-22 | Discharge: 2017-11-22 | Disposition: A | Discharge status: Home / Self Care | Payer: BLUE CROSS/BLUE SHIELD | Source: Ambulatory Visit | Attending: Obstetrics & Gynecology | Admitting: Obstetrics & Gynecology

## 2017-11-22 DIAGNOSIS — Z1231 Encounter for screening mammogram for malignant neoplasm of breast: Secondary | ICD-10-CM

## 2018-01-20 DIAGNOSIS — Z01419 Encounter for gynecological examination (general) (routine) without abnormal findings: Secondary | ICD-10-CM | POA: Diagnosis not present

## 2018-01-20 DIAGNOSIS — Z6830 Body mass index (BMI) 30.0-30.9, adult: Secondary | ICD-10-CM | POA: Diagnosis not present

## 2018-01-20 DIAGNOSIS — Z124 Encounter for screening for malignant neoplasm of cervix: Secondary | ICD-10-CM | POA: Diagnosis not present

## 2018-03-14 ENCOUNTER — Encounter: Payer: Self-pay | Admitting: Nurse Practitioner

## 2018-03-14 ENCOUNTER — Ambulatory Visit (INDEPENDENT_AMBULATORY_CARE_PROVIDER_SITE_OTHER): Payer: BLUE CROSS/BLUE SHIELD | Admitting: Nurse Practitioner

## 2018-03-14 VITALS — BP 111/64 | HR 77 | Temp 97.8°F | Ht 63.0 in | Wt 170.0 lb

## 2018-03-14 DIAGNOSIS — B009 Herpesviral infection, unspecified: Secondary | ICD-10-CM | POA: Diagnosis not present

## 2018-03-14 DIAGNOSIS — K219 Gastro-esophageal reflux disease without esophagitis: Secondary | ICD-10-CM

## 2018-03-14 DIAGNOSIS — Z6829 Body mass index (BMI) 29.0-29.9, adult: Secondary | ICD-10-CM

## 2018-03-14 DIAGNOSIS — R232 Flushing: Secondary | ICD-10-CM

## 2018-03-14 MED ORDER — VALACYCLOVIR HCL 1 G PO TABS: 1000.0000 mg | ORAL_TABLET | Freq: Every day | ORAL | 1 refills | Status: DC

## 2018-03-14 MED ORDER — OMEPRAZOLE 40 MG PO CPDR: 40.0000 mg | DELAYED_RELEASE_CAPSULE | Freq: Every day | ORAL | 1 refills | Status: DC

## 2018-03-14 NOTE — Progress Notes (Signed)
Subjective:    Patient ID: Hannah Reese, female    DOB: 04/06/1961, 57 y.o.   MRN: [id number]   Chief Complaint: Medical Management of Chronic Issues   HPI:  1. Gastroesophageal reflux disease without esophagitis  Patient takes omeprazole daily. Works well to keep symptoms under control.  2. BMI 29.0-29.9,adult  No recent weight changes  3. HSV-1 (herpes simplex virus 1) infection  Takes valtrex daily. Had a fever blister last week.    Outpatient Encounter Medications as of 03/14/2018  Medication Sig  . Black Cohosh 540 MG CAPS Take 1 capsule by mouth daily.  . Cholecalciferol (VITAMIN D) 1000 UNITS capsule Take 1,000 Units by mouth daily.  . cyclobenzaprine (FLEXERIL) 5 MG tablet Take 1 tablet (5 mg total) by mouth 3 (three) times daily as needed for muscle spasms.  Marland Kitchen ibuprofen (ADVIL,MOTRIN) 800 MG tablet Take 1 tablet (800 mg total) by mouth every 8 (eight) hours as needed for pain.  Marland Kitchen omeprazole (PRILOSEC) 40 MG capsule Take 1 capsule (40 mg total) by mouth daily.  . valACYclovir (VALTREX) 1000 MG tablet Take 1 tablet (1,000 mg total) by mouth daily.       New complaints: Having hot flashes and would like horomone levels checked  Social history: Works at [employer]  Constitutional: Negative for activity change and appetite change.  HENT: Negative.   Eyes: Negative for pain.  Respiratory: Negative for shortness of breath.   Cardiovascular: Negative for chest pain, palpitations and leg swelling.  Gastrointestinal: Negative for abdominal pain.  Endocrine: Negative for polydipsia.  Genitourinary: Negative.   Skin: Negative for rash.  Neurological: Negative for dizziness, weakness and headaches.  Hematological: Does not bruise/bleed easily.  Psychiatric/Behavioral: Negative.   All other systems reviewed and are negative.      Objective:   Physical Exam  Constitutional: She is oriented to person, place, and time.  HENT:  Head:  Normocephalic.  Nose: Nose normal.  Mouth/Throat: Oropharynx is clear and moist.  Eyes: Pupils are equal, round, and reactive to light. EOM are normal.  Neck: Normal range of motion. Neck supple. No JVD present. Carotid bruit is not present.  Cardiovascular: Normal rate, regular rhythm, normal heart sounds and intact distal pulses.  Pulmonary/Chest: Effort normal and breath sounds normal. No respiratory distress. She has no wheezes. She has no rales. She exhibits no tenderness.  Abdominal: Soft. Normal appearance, normal aorta and bowel sounds are normal. She exhibits no distension, no abdominal bruit, no pulsatile midline mass and no mass. There is no splenomegaly or hepatomegaly. There is no tenderness.  Musculoskeletal: Normal range of motion. She exhibits no edema.  Lymphadenopathy:    She has no cervical adenopathy.  Neurological: She is alert and oriented to person, place, and time. She has normal reflexes.  Skin: Skin is warm and dry.  Psychiatric: Judgment normal.   BP 111/64   Pulse 77   Temp 97.8 F (36.6 C) (Oral)   Ht _0  (1.6 m)   Wt 170 lb (77.1 kg)   BMI 30.11 kg/m       Assessment & Plan:  GRETCHEN WEINFELD comes in today with chief complaint of Medical Management of Chronic Issues   Diagnosis and orders addressed:  1. Gastroesophageal reflux disease without esophagitis Avoid spicy foods Do not eat 2 hours prior to bedtime - omeprazole (PRILOSEC) 40 MG capsule; Take 1 capsule (40 mg total) by mouth daily.  Dispense: 90 capsule; Refill: 1 -  CMP14+EGFR  2. BMI 29.0-29.9,adult Discussed diet and exercise for person with BMI >25 Will recheck weight in 3-6 months  3. HSV-1 (herpes simplex virus 1) infection Take meds daily to prevent flare up - valACYclovir (VALTREX) 1000 MG tablet; Take 1 tablet (1,000 mg total) by mouth daily.  Dispense: 90 tablet; Refill: 1  4. Hot flashes - AMENORRHEA PROFILE   Labs pending Health Maintenance reviewed Diet and  exercise encouraged  Follow up plan: 6 months   Mary-Margaret Hassell Done, FNP

## 2018-03-14 NOTE — Patient Instructions (Signed)
Menopause and Herbal Products What is menopause? Menopause is the normal time of life when menstrual periods decrease in frequency and eventually stop completely. This process can take several years for some women. Menopause is complete when you have had an absence of menstruation for a full year since your last menstrual period. It usually occurs between the ages of 48 and 55. It is not common for menopause to begin before the age of 40. During menopause, your body stops producing the female hormones estrogen and progesterone. Common symptoms associated with this loss of hormones (vasomotor symptoms) are:  Hot flashes.  Hot flushes.  Night sweats.  Other common symptoms and complications of menopause include:  Decrease in sex drive.  Vaginal dryness and thinning of the walls of the vagina. This can make sex painful.  Dryness of the skin and development of wrinkles.  Headaches.  Tiredness.  Irritability.  Memory problems.  Weight gain.  Bladder infections.  Hair growth on the face and chest.  Inability to reproduce offspring (infertility).  Loss of density in the bones (osteoporosis) increasing your risk for breaks (fractures).  Depression.  Hardening and narrowing of the arteries (atherosclerosis). This increases your risk of heart attack and stroke.  What treatment options are available? There are many treatment choices for menopause symptoms. The most common treatment is hormone replacement therapy. Many alternative therapies for menopause are emerging, including the use of herbal products. These supplements can be found in the form of herbs, teas, oils, tinctures, and pills. Common herbal supplements for menopause are made from plants that contain phytoestrogens. Phytoestrogens are compounds that occur naturally in plants and plant products. They act like estrogen in the body. Foods and herbs that contain phytoestrogens include:  Soy.  Flax seeds.  Red  clover.  Ginseng.  What menopause symptoms may be helped if I use herbal products?  Vasomotor symptoms. These may be helped by: ? Soy. Some studies show that soy may have a moderate benefit for hot flashes. ? Black cohosh. There is limited evidence indicating this may be beneficial for hot flashes.  Symptoms that are related to heart and blood vessel disease. These may be helped by soy. Studies have shown that soy can help to lower cholesterol.  Depression. This may be helped by: ? St. John's wort. There is limited evidence that shows this may help mild to moderate depression. ? Black cohosh. There is evidence that this may help depression and mood swings.  Osteoporosis. Soy may help to decrease bone loss that is associated with menopause and may prevent osteoporosis. Limited evidence indicates that red clover may offer some bone loss protection as well. Other herbal products that are commonly used during menopause lack enough evidence to support their use as a replacement for conventional menopause therapies. These products include evening primrose, ginseng, and red clover. What are the cases when herbal products should not be used during menopause? Do not use herbal products during menopause without your health care provider's approval if:  You are taking medicine.  You have a preexisting liver condition.  Are there any risks in my taking herbal products during menopause? If you choose to use herbal products to help with symptoms of menopause, keep in mind that:  Different supplements have different and unmeasured amounts of herbal ingredients.  Herbal products are not regulated the same way that medicines are.  Concentrations of herbs may vary depending on the way they are prepared. For example, the concentration may be different in a pill,   tea, oil, and tincture.  Little is known about the risks of using herbal products, particularly the risks of long-term use.  Some herbal  supplements can be harmful when combined with certain medicines.  Most commonly reported side effects of herbal products are mild. However, if used improperly, many herbal supplements can cause serious problems. Talk to your health care provider before starting any herbal product. If problems develop, stop taking the supplement and let your health care provider know. This information is not intended to replace advice given to you by your health care provider. Make sure you discuss any questions you have with your health care provider. Document Released: 04/06/2008 Document Revised: 09/15/2016 Document Reviewed: 04/03/2014 Else[employer] Interactive Patient Education  2017 [employer]  

## 2018-03-15 LAB — CMP14+EGFR
ALT: 11 IU/L (ref 0–32)
AST: 17 IU/L (ref 0–40)
Albumin/Globulin Ratio: 2 (ref 1.2–2.2)
Albumin: 4.7 g/dL (ref 3.5–5.5)
Alkaline Phosphatase: 88 IU/L (ref 39–117)
BUN / CREAT RATIO: 18 (ref 9–23)
BUN: 19 mg/dL (ref 6–24)
Bilirubin Total: 0.3 mg/dL (ref 0.0–1.2)
CALCIUM: 10 mg/dL (ref 8.7–10.2)
CO2: 26 mmol/L (ref 20–29)
CREATININE: 1.04 mg/dL — AB (ref 0.57–1.00)
Chloride: 101 mmol/L (ref 96–106)
GFR, EST AFRICAN AMERICAN: 70 mL/min/{1.73_m2} (ref >59)
GFR, EST NON AFRICAN AMERICAN: 61 mL/min/{1.73_m2} (ref >59)
Globulin, Total: 2.3 g/dL (ref 1.5–4.5)
Glucose: 90 mg/dL (ref 65–99)
Potassium: 4.6 mmol/L (ref 3.5–5.2)
Sodium: 141 mmol/L (ref 134–144)
Total Protein: 7 g/dL (ref 6.0–8.5)

## 2018-03-15 LAB — AMENORRHEA PROFILE
FSH: 92.2 m[IU]/mL
LH: 44.2 m[IU]/mL
PROLACTIN: 10.8 ng/mL (ref 4.8–23.3)

## 2018-05-25 ENCOUNTER — Encounter: Payer: Self-pay | Admitting: Nurse Practitioner

## 2018-05-26 ENCOUNTER — Other Ambulatory Visit: Payer: Self-pay | Admitting: Nurse Practitioner

## 2018-05-26 MED ORDER — NALTREXONE-BUPROPION HCL ER 8-90 MG PO TB12: ORAL_TABLET | ORAL | 0 refills | Status: DC

## 2018-06-22 ENCOUNTER — Other Ambulatory Visit: Payer: Self-pay | Admitting: Nurse Practitioner

## 2018-06-23 ENCOUNTER — Other Ambulatory Visit: Payer: Self-pay | Admitting: Nurse Practitioner

## 2018-06-23 MED ORDER — NALTREXONE-BUPROPION HCL ER 8-90 MG PO TB12: 2.0000 | ORAL_TABLET | Freq: Two times a day (BID) | ORAL | 2 refills | Status: DC

## 2018-07-18 ENCOUNTER — Ambulatory Visit (INDEPENDENT_AMBULATORY_CARE_PROVIDER_SITE_OTHER): Payer: BLUE CROSS/BLUE SHIELD | Admitting: Nurse Practitioner

## 2018-07-18 ENCOUNTER — Encounter: Payer: Self-pay | Admitting: Nurse Practitioner

## 2018-07-18 VITALS — BP 118/78 | HR 60 | Temp 97.6°F | Ht 63.0 in | Wt 173.0 lb

## 2018-07-18 DIAGNOSIS — J40 Bronchitis, not specified as acute or chronic: Secondary | ICD-10-CM | POA: Diagnosis not present

## 2018-07-18 MED ORDER — METHYLPREDNISOLONE ACETATE 80 MG/ML IJ SUSP
80.0000 mg | Freq: Once | INTRAMUSCULAR | Status: AC
  Administered 2018-07-18: 80 mg via INTRAMUSCULAR

## 2018-07-18 MED ORDER — BENZONATATE 100 MG PO CAPS: 100.0000 mg | ORAL_CAPSULE | Freq: Three times a day (TID) | ORAL | 0 refills | Status: DC | PRN

## 2018-07-18 NOTE — Patient Instructions (Signed)

## 2018-07-18 NOTE — Progress Notes (Signed)
   Subjective:    Patient ID: Hannah Reese Durrell, female    DOB: 01-09-1961, 57 y.o.   MRN: [id number]   Chief Complaint: Cough and chest congestion   HPI Patient come sin today c/o cough that started about 10 days ago. She has been using mucinex and robitussin OTC with no relief.    Review of Systems  Constitutional: Negative for chills and fever.  HENT: Positive for congestion (chest ). Negative for rhinorrhea, sinus pressure, sinus pain, sore throat and trouble swallowing.   Respiratory: Positive for cough. Negative for shortness of breath.   Cardiovascular: Negative.   Gastrointestinal: Negative.   Genitourinary: Negative.   Neurological: Negative.   Psychiatric/Behavioral: Negative.   All other systems reviewed and are negative.      Objective:   Physical Exam  Constitutional: She is oriented to person, place, and time. She appears well-developed and well-nourished. No distress.  HENT:  Right Ear: External ear normal.  Left Ear: External ear normal.  Nose: Nose normal.  Mouth/Throat: Oropharynx is clear and moist.  Eyes: Pupils are equal, round, and reactive to light. EOM are normal.  Neck: Normal range of motion. Neck supple.  Cardiovascular: Normal rate and regular rhythm.  Pulmonary/Chest: Effort normal and breath sounds normal.  Dry tight cough  Neurological: She is alert and oriented to person, place, and time.  Skin: Skin is warm and dry.  Psychiatric: She has a normal mood and affect. Her behavior is normal. Thought content normal.  Nursing note and vitals reviewed.  BP 118/78   Pulse 60   Temp 97.6 F (36.4 C) (Oral)   Ht 5\' 3"  (1.6 m)   Wt 173 lb (78.5 kg)   BMI 30.65 kg/m       Assessment & Plan:  Hannah Reese Guymon in today with chief complaint of Cough and chest congestion   1. Bronchitis 1. Take meds as prescribed 2. Use a cool mist humidifier especially during the winter months and when heat has been humid. 3. Use saline nose sprays  frequently 4. Saline irrigations of the nose can be very helpful if done frequently.  * 4X daily for 1 week*  * Use of a nettie pot can be helpful with this. Follow directions with this* 5. Drink plenty of fluids 6. Keep thermostat turn down low 7.For any cough or congestion  Use plain Mucinex- regular strength or max strength is fine   * Children- consult with Pharmacist for dosing 8. For fever or aces or pains- take tylenol or ibuprofen appropriate for age and weight.  * for fevers greater than 101 orally you may alternate ibuprofen and tylenol every  3 hours.    - benzonatate (TESSALON PERLES) 100 MG capsule; Take 1 capsule (100 mg total) by mouth 3 (three) times daily as needed for cough.  Dispense: 20 capsule; Refill: 0 - methylPREDNISolone acetate (DEPO-MEDROL) injection 80 mg  Mary-Margaret Daphine DeutscherMartin, FNP

## 2018-08-03 NOTE — Telephone Encounter (Signed)
Patient states she is no better, now with more sinus congestion, and continued cough.  Tessalon pearls not helping with cough.  Scheduled patient for appointment with Bennie Pierini, FNP for tomorrow for recheck.

## 2018-08-04 ENCOUNTER — Ambulatory Visit (INDEPENDENT_AMBULATORY_CARE_PROVIDER_SITE_OTHER): Payer: BLUE CROSS/BLUE SHIELD | Admitting: Nurse Practitioner

## 2018-08-04 ENCOUNTER — Encounter: Payer: Self-pay | Admitting: Nurse Practitioner

## 2018-08-04 VITALS — BP 126/73 | HR 83 | Temp 98.0°F | Ht 63.0 in | Wt 173.0 lb

## 2018-08-04 DIAGNOSIS — J069 Acute upper respiratory infection, unspecified: Secondary | ICD-10-CM | POA: Diagnosis not present

## 2018-08-04 MED ORDER — AZITHROMYCIN 250 MG PO TABS: ORAL_TABLET | ORAL | 0 refills | Status: DC

## 2018-08-04 MED ORDER — CHLORPHEN-PE-ACETAMINOPHEN 4-10-325 MG PO TABS: 1.0000 | ORAL_TABLET | Freq: Four times a day (QID) | ORAL | 0 refills | Status: DC | PRN

## 2018-08-04 NOTE — Patient Instructions (Signed)

## 2018-08-04 NOTE — Progress Notes (Signed)
Subjective:    Patient ID: Hannah Reese, female    DOB: 1960-11-20, 57 y.o.   MRN: [id number]   Chief Complaint: Nasal Congestion and Cough   HPI Patient was sen on 07/18/18 with cough and congestion. She was given tessalon perles and depomedrol injection.she feels no better. She feels more congested and is still coughing.   Review of Systems  Constitutional: Negative for chills and fever.  HENT: Positive for congestion, postnasal drip and sinus pain. Negative for sore throat and trouble swallowing.   Respiratory: Positive for cough.   Cardiovascular: Negative.   Gastrointestinal: Negative.   Genitourinary: Negative.   Neurological: Positive for headaches.  Psychiatric/Behavioral: Negative.   All other systems reviewed and are negative.      Objective:   Physical Exam  Constitutional: She is oriented to person, place, and time. She appears well-developed and well-nourished. No distress.  HENT:  Head: Normocephalic.  Right Ear: Hearing, tympanic membrane, external ear and ear canal normal.  Left Ear: Hearing, tympanic membrane, external ear and ear canal normal.  Nose: Mucosal edema and rhinorrhea present. Right sinus exhibits no maxillary sinus tenderness and no frontal sinus tenderness. Left sinus exhibits no maxillary sinus tenderness and no frontal sinus tenderness.  Mouth/Throat: Uvula is midline, oropharynx is clear and moist and mucous membranes are normal.  Eyes: Pupils are equal, round, and reactive to light. EOM are normal.  Neck: Normal range of motion. Neck supple. No JVD present. Carotid bruit is not present.  Cardiovascular: Normal rate, regular rhythm, normal heart sounds and intact distal pulses.  Pulmonary/Chest: Effort normal and breath sounds normal. No respiratory distress. She has no wheezes. She has no rales. She exhibits no tenderness.  Abdominal: Soft. Normal appearance, normal aorta and bowel sounds are normal. She exhibits no distension, no abdominal  bruit, no pulsatile midline mass and no mass. There is no splenomegaly or hepatomegaly. There is no tenderness.  Musculoskeletal: Normal range of motion. She exhibits no edema.  Lymphadenopathy:    She has no cervical adenopathy.  Neurological: She is alert and oriented to person, place, and time. She has normal reflexes.  Skin: Skin is warm and dry.  Psychiatric: She has a normal mood and affect. Her behavior is normal. Judgment and thought content normal.  Nursing note and vitals reviewed.  BP 126/73   Pulse 83   Temp 98 F (36.7 C) (Oral)   Ht 5\' 3"  (1.6 m)   Wt 173 lb (78.5 kg)   BMI 30.65 kg/m       Assessment & Plan:  YANA SCHORR in today with chief complaint of Nasal Congestion and Cough   1. Upper respiratory infection with cough and congestion 1. Take meds as prescribed 2. Use a cool mist humidifier especially during the winter months and when heat has been humid. 3. Use saline nose sprays frequently 4. Saline irrigations of the nose can be very helpful if done frequently.  * 4X daily for 1 week*  * Use of a nettie pot can be helpful with this. Follow directions with this* 5. Drink plenty of fluids 6. Keep thermostat turn down low 7.For any cough or congestion  Use plain Mucinex- regular strength or max strength is fine   * Children- consult with Pharmacist for dosing 8. For fever or aces or pains- take tylenol or ibuprofen appropriate for age and weight.  * for fevers greater than 101 orally you may alternate ibuprofen and tylenol every  3 hours.  Meds ordered this encounter  Medications  . azithromycin (ZITHROMAX Z-PAK) 250 MG tablet    Sig: As directed    Dispense:  6 tablet    Refill:  0    Order Specific Question:   Supervising Provider    Answer:   VINCENT, CAROL L [[id number]]  . Chlorphen-PE-Acetaminophen 4-10-325 MG TABS    Sig: Take 1 tablet by mouth every 6 (six) hours as needed.    Dispense:  20 tablet    Refill:  0    Order Specific Question:    Supervising Provider    Answer:   Johna Sheriff [[id number]]   Mary-Margaret Daphine Deutscher, FNP

## 2018-08-22 ENCOUNTER — Ambulatory Visit (INDEPENDENT_AMBULATORY_CARE_PROVIDER_SITE_OTHER): Payer: BLUE CROSS/BLUE SHIELD

## 2018-08-22 DIAGNOSIS — Z23 Encounter for immunization: Secondary | ICD-10-CM | POA: Diagnosis not present

## 2018-09-10 ENCOUNTER — Other Ambulatory Visit: Payer: Self-pay | Admitting: Nurse Practitioner

## 2018-09-10 DIAGNOSIS — K219 Gastro-esophageal reflux disease without esophagitis: Secondary | ICD-10-CM

## 2018-12-12 ENCOUNTER — Other Ambulatory Visit: Payer: Self-pay | Admitting: Nurse Practitioner

## 2018-12-12 DIAGNOSIS — K219 Gastro-esophageal reflux disease without esophagitis: Secondary | ICD-10-CM

## 2018-12-30 ENCOUNTER — Other Ambulatory Visit: Payer: Self-pay | Admitting: Nurse Practitioner

## 2018-12-30 DIAGNOSIS — Z1231 Encounter for screening mammogram for malignant neoplasm of breast: Secondary | ICD-10-CM

## 2019-01-24 ENCOUNTER — Ambulatory Visit: Payer: BLUE CROSS/BLUE SHIELD

## 2019-03-12 ENCOUNTER — Other Ambulatory Visit: Payer: Self-pay | Admitting: Nurse Practitioner

## 2019-03-12 DIAGNOSIS — K219 Gastro-esophageal reflux disease without esophagitis: Secondary | ICD-10-CM

## 2019-03-13 NOTE — Telephone Encounter (Signed)
Needs appointment last follow up 03/14/2012

## 2019-03-14 ENCOUNTER — Encounter: Payer: Self-pay | Admitting: Nurse Practitioner

## 2019-03-14 ENCOUNTER — Other Ambulatory Visit: Payer: Self-pay

## 2019-03-14 ENCOUNTER — Ambulatory Visit (INDEPENDENT_AMBULATORY_CARE_PROVIDER_SITE_OTHER): Payer: BLUE CROSS/BLUE SHIELD | Admitting: Nurse Practitioner

## 2019-03-14 DIAGNOSIS — K219 Gastro-esophageal reflux disease without esophagitis: Secondary | ICD-10-CM

## 2019-03-14 DIAGNOSIS — M545 Low back pain, unspecified: Secondary | ICD-10-CM

## 2019-03-14 DIAGNOSIS — B009 Herpesviral infection, unspecified: Secondary | ICD-10-CM | POA: Diagnosis not present

## 2019-03-14 DIAGNOSIS — Z6829 Body mass index (BMI) 29.0-29.9, adult: Secondary | ICD-10-CM | POA: Diagnosis not present

## 2019-03-14 MED ORDER — CYCLOBENZAPRINE HCL 5 MG PO TABS: 5.0000 mg | ORAL_TABLET | Freq: Three times a day (TID) | ORAL | 1 refills | Status: DC | PRN

## 2019-03-14 MED ORDER — VALACYCLOVIR HCL 1 G PO TABS: 1000.0000 mg | ORAL_TABLET | Freq: Every day | ORAL | 1 refills | Status: DC

## 2019-03-14 MED ORDER — OMEPRAZOLE 40 MG PO CPDR: 40.0000 mg | DELAYED_RELEASE_CAPSULE | Freq: Every day | ORAL | 1 refills | Status: DC

## 2019-03-14 NOTE — Progress Notes (Signed)
Virtual Visit via telephone Note  I connected with Hannah BreechEmily S Brosious on 03/14/19 at 9:15 AM by telephone and verified that I am speaking with the correct person using two identifiers. Hannah Breechmily S Uhde is currently located at home and no one is currently with her during visit. The provider, Mary-Margaret Daphine DeutscherMartin, FNP is located in their office at time of visit.  I discussed the limitations, risks, security and privacy concerns of performing an evaluation and management service by telephone and the availability of in person appointments. I also discussed with the patient that there may be a patient responsible charge related to this service. The patient expressed understanding and agreed to proceed.   History and Present Illness:   Chief Complaint: Medical Management of Chronic Issues    HPI:  1. Gastroesophageal reflux disease without esophagitis Takes a daily omeprazole and works well to keep symptoms under control.  2. 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 Is doing well.  3. BMI 29.0-29.9,adult No recent weight changes    Outpatient Encounter Medications as of 03/14/2019  Medication Sig  . azithromycin (ZITHROMAX Z-PAK) 250 MG tablet As directed  . Black Cohosh 540 MG CAPS Take 1 capsule by mouth daily.  . Cholecalciferol (VITAMIN D) 1000 UNITS capsule Take 1,000 Units by mouth daily.  Marland Kitchen. ibuprofen (ADVIL,MOTRIN) 800 MG tablet Take 1 tablet (800 mg total) by mouth every 8 (eight) hours as needed for pain.  . Naltrexone-buPROPion HCl ER (CONTRAVE) 8-90 MG TB12 1 tablet Po Qam x7d ; then 1 tab po BID x 7 d; then 2 tab in AM and 1 in PM X7d; Then 2 po BID from then on  . omeprazole (PRILOSEC) 40 MG capsule TAKE 1 CAPSULE DAILY (NEED TO BE SEEN BEFORE NEXT REFILL)  . valACYclovir (VALTREX) 1000 MG tablet Take 1 tablet (1,000 mg total) by mouth daily.       New complaints: None today  Social history: Lives with her boyfriend. Has a new  grandbaby- ' Arita MissDawson "     Review of Systems  Constitutional: Negative for diaphoresis and weight loss.  Eyes: Negative for blurred vision, double vision and pain.  Respiratory: Negative for shortness of breath.   Cardiovascular: Negative for chest pain, palpitations, orthopnea and leg swelling.  Gastrointestinal: Negative for abdominal pain.  Skin: Negative for rash.  Neurological: Negative for dizziness, sensory change, loss of consciousness, weakness and headaches.  Endo/Heme/Allergies: Negative for polydipsia. Does not bruise/bleed easily.  Psychiatric/Behavioral: Negative for memory loss. The patient does not have insomnia.   All other systems reviewed and are negative.    Observations/Objective: Alert and oriented- answers all question appropriatley No distress  Assessment and Plan: Hannah Reese comes in today with chief complaint of Medical Management of Chronic Issues   Diagnosis and orders addressed:  1. Gastroesophageal reflux disease without esophagitis Avoid spicy foods Do not eat 2 hours prior to bedtime - omeprazole (PRILOSEC) 40 MG capsule; Take 1 capsule (40 mg total) by mouth daily.  Dispense: 90 capsule; Refill: 1  2. HSV-1 (herpes simplex virus 1) infection Safe sex - valACYclovir (VALTREX) 1000 MG tablet; Take 1 tablet (1,000 mg total) by mouth daily.  Dispense: 90 tablet; Refill: 1  3. BMI 29.0-29.9,adult Discussed diet and exercise for person with BMI >25 Will recheck weight in 3-6 months  4. Low back pain - cyclobenzaprine (FLEXERIL) 5 MG tablet; Take 1 tablet (5 mg total) by mouth 3 (three) times daily as  needed for muscle spasms.  Dispense: 30 tablet; Refill: 1   Previous labs reviewed Health Maintenance reviewed Diet and exercise encouraged  Follow up plan: 6 months     I discussed the assessment and treatment plan with the patient. The patient was provided an opportunity to ask questions and all were answered. The patient agreed with  the plan and demonstrated an understanding of the instructions.   The patient was advised to call back or seek an in-person evaluation if the symptoms worsen or if the condition fails to improve as anticipated.  The above assessment and management plan was discussed with the patient. The patient verbalized understanding of and has agreed to the management plan. Patient is aware to call the clinic if symptoms persist or worsen. Patient is aware when to return to the clinic for a follow-up visit. Patient educated on when it is appropriate to go to the emergency department.   Time call ended:  9:25  I provided 15 minutes of non-face-to-face time during this encounter.    Mary-Margaret Daphine Deutscher, FNP

## 2019-05-17 ENCOUNTER — Ambulatory Visit
Admission: RE | Admit: 2019-05-17 | Discharge: 2019-05-17 | Disposition: A | Discharge status: Home / Self Care | Payer: BC Managed Care – PPO | Source: Ambulatory Visit | Attending: Nurse Practitioner | Admitting: Nurse Practitioner

## 2019-05-17 ENCOUNTER — Other Ambulatory Visit: Payer: Self-pay

## 2019-05-17 DIAGNOSIS — Z1231 Encounter for screening mammogram for malignant neoplasm of breast: Secondary | ICD-10-CM | POA: Diagnosis not present

## 2019-07-26 ENCOUNTER — Other Ambulatory Visit: Payer: Self-pay

## 2019-07-27 ENCOUNTER — Ambulatory Visit (INDEPENDENT_AMBULATORY_CARE_PROVIDER_SITE_OTHER): Payer: BC Managed Care – PPO

## 2019-07-27 ENCOUNTER — Encounter: Payer: Self-pay | Admitting: Nurse Practitioner

## 2019-07-27 ENCOUNTER — Ambulatory Visit (INDEPENDENT_AMBULATORY_CARE_PROVIDER_SITE_OTHER): Payer: BC Managed Care – PPO | Admitting: Nurse Practitioner

## 2019-07-27 VITALS — BP 107/67 | HR 74 | Temp 97.6°F | Ht 63.0 in | Wt 175.0 lb

## 2019-07-27 DIAGNOSIS — K219 Gastro-esophageal reflux disease without esophagitis: Secondary | ICD-10-CM | POA: Diagnosis not present

## 2019-07-27 DIAGNOSIS — Z6829 Body mass index (BMI) 29.0-29.9, adult: Secondary | ICD-10-CM | POA: Diagnosis not present

## 2019-07-27 DIAGNOSIS — B009 Herpesviral infection, unspecified: Secondary | ICD-10-CM

## 2019-07-27 DIAGNOSIS — Z0001 Encounter for general adult medical examination with abnormal findings: Secondary | ICD-10-CM | POA: Diagnosis not present

## 2019-07-27 DIAGNOSIS — Z0000 Encounter for general adult medical examination without abnormal findings: Secondary | ICD-10-CM

## 2019-07-27 DIAGNOSIS — Z23 Encounter for immunization: Secondary | ICD-10-CM | POA: Diagnosis not present

## 2019-07-27 DIAGNOSIS — Z1159 Encounter for screening for other viral diseases: Secondary | ICD-10-CM

## 2019-07-27 MED ORDER — VALACYCLOVIR HCL 1 G PO TABS: 1000.0000 mg | ORAL_TABLET | Freq: Every day | ORAL | 1 refills | Status: DC

## 2019-07-27 MED ORDER — CONTRAVE 8-90 MG PO TB12: ORAL_TABLET | ORAL | 2 refills | Status: DC

## 2019-07-27 MED ORDER — OMEPRAZOLE 40 MG PO CPDR: 40.0000 mg | DELAYED_RELEASE_CAPSULE | Freq: Every day | ORAL | 1 refills | Status: DC

## 2019-07-27 NOTE — Progress Notes (Signed)
 Subjective:    Patient ID: Hannah Reese, female    DOB: 12/10/1960, 58 y.o.   MRN: [id number]   Chief Complaint: annual physical   HPI:  1. Annual physical exam Has not had pap since 2014  2. Gastroesophageal reflux disease without esophagitis Is on omeprazole daily and works well to keep symptoms under control  3. HSV-1 (herpes simplex virus 1) infection Takes valtrex daily and has had no recentt flare ups.  4. BMI 29.0-29.9,adult No recent weight changes Wt Readings from Last 3 Encounters:  08/04/18 173 lb (78.5 kg)  07/18/18 173 lb (78.5 kg)  03/14/18 170 lb (77.1 kg)   BMI Readings from Last 3 Encounters:  08/04/18 30.65 kg/m  07/18/18 30.65 kg/m  03/14/18 30.11 kg/m      Outpatient Encounter Medications as of 07/27/2019  Medication Sig  . Black Cohosh 540 MG CAPS Take 1 capsule by mouth daily.  . Cholecalciferol (VITAMIN D) 1000 UNITS capsule Take 1,000 Units by mouth daily.  . cyclobenzaprine (FLEXERIL) 5 MG tablet Take 1 tablet (5 mg total) by mouth 3 (three) times daily as needed for muscle spasms.  . ibuprofen (ADVIL,MOTRIN) 800 MG tablet Take 1 tablet (800 mg total) by mouth every 8 (eight) hours as needed for pain.  . Naltrexone-buPROPion HCl ER (CONTRAVE) 8-90 MG TB12 1 tablet Po Qam x7d ; then 1 tab po BID x 7 d; then 2 tab in AM and 1 in PM X7d; Then 2 po BID from then on  . omeprazole (PRILOSEC) 40 MG capsule Take 1 capsule (40 mg total) by mouth daily.  . valACYclovir (VALTREX) 1000 MG tablet Take 1 tablet (1,000 mg total) by mouth daily.    Past Surgical History:  Procedure Laterality Date  . ABLATION  2012  . AUGMENTATION MAMMAPLASTY Bilateral   . BLADDER SUSPENSION  06/12/2011   Procedure: TRANSVAGINAL TAPE (TVT) PROCEDURE;  Surgeon: Richard D Kaplan;  Location: [facility];  Service: Gynecology;  Laterality: N/A;  Transobturator  . BREAST SURGERY    . TUBAL LIGATION      Family History  Problem Relation Age of Onset  . Alzheimer's disease  Mother   . Hypertension Father   . Heart disease Father   . Cancer Father   . Healthy Sister   . Early death Brother   . Healthy Sister   . Colon cancer Neg Hx   . Breast cancer Neg Hx     New complaints: None today  Social history: Lives with her husband- she is a new grandma and is loving it  Controlled substance contract: n/a    Review of Systems  Constitutional: Negative for activity change and appetite change.  HENT: Negative.   Eyes: Negative for pain.  Respiratory: Negative for shortness of breath.   Cardiovascular: Negative for chest pain, palpitations and leg swelling.  Gastrointestinal: Negative for abdominal pain.  Endocrine: Negative for polydipsia.  Genitourinary: Negative.   Skin: Negative for rash.  Neurological: Negative for dizziness, weakness and headaches.  Hematological: Does not bruise/bleed easily.  Psychiatric/Behavioral: Negative.   All other systems reviewed and are negative.      Objective:   Physical Exam Vitals signs and nursing note reviewed.  Constitutional:      General: She is not in acute distress.    Appearance: Normal appearance. She is well-developed.  HENT:     Head: Normocephalic.     Nose: Nose normal.  Eyes:     Pupils: Pupils are equal, round, and   reactive to light.  Neck:     Musculoskeletal: Normal range of motion and neck supple.     Vascular: No carotid bruit or JVD.  Cardiovascular:     Rate and Rhythm: Normal rate and regular rhythm.     Heart sounds: Normal heart sounds.  Pulmonary:     Effort: Pulmonary effort is normal. No respiratory distress.     Breath sounds: Normal breath sounds. No wheezing or rales.  Chest:     Chest wall: No tenderness.  Abdominal:     General: Bowel sounds are normal. There is no distension or abdominal bruit.     Palpations: Abdomen is soft. There is no hepatomegaly, splenomegaly, mass or pulsatile mass.     Tenderness: There is no abdominal tenderness.  Genitourinary:     General: Normal vulva.     Vagina: No vaginal discharge.     Rectum: Normal.     Comments: Cervix parous and pink No adnexal masses or tenderness Musculoskeletal: Normal range of motion.  Lymphadenopathy:     Cervical: No cervical adenopathy.  Skin:    General: Skin is warm and dry.  Neurological:     Mental Status: She is alert and oriented to person, place, and time.     Deep Tendon Reflexes: Reflexes are normal and symmetric.  Psychiatric:        Behavior: Behavior normal.        Thought Content: Thought content normal.        Judgment: Judgment normal.     BP 107/67   Pulse 74   Temp 97.6 F (36.4 C) (Oral)   Ht 5' 3" (1.6 m)   Wt 175 lb (79.4 kg)   SpO2 100%   BMI 31.00 kg/m   Chest xray normal-Hannah Martin, Hannah Reese ekg-[facility]-Preliminary reading by Mary Martin, Hannah Reese  [facility]      Assessment & Plan:  Aaryana S Sarr comes in today with chief complaint of Gynecologic Exam   Diagnosis and orders addressed:  1. Annual physical exam - CMP14+EGFR - Lipid panel - CBC with Differential/Platelet - Thyroid Panel With TSH - IGP, Aptima HPV, rfx 16/18,45 - DG Chest 2 View; Future - EKG 12-Lead  2. Gastroesophageal reflux disease without esophagitis Avoid spicy foods Do not eat 2 hours prior to bedtime - omeprazole (PRILOSEC) 40 MG capsule; Take 1 capsule (40 mg total) by mouth daily.  Dispense: 90 capsule; Refill: 1  3. HSV-1 (herpes simplex virus 1) infection - valACYclovir (VALTREX) 1000 MG tablet; Take 1 tablet (1,000 mg total) by mouth daily.  Dispense: 90 tablet; Refill: 1  4. BMI 29.0-29.9,adult Discussed diet and exercise for person with BMI >25 Will recheck weight in 3-6 months  5. Need for hepatitis C screening test - Hepatitis C antibody   Labs pending Health Maintenance reviewed Diet and exercise encouraged  Follow up plan: 6 months   Hannah Martin, Hannah Reese  

## 2019-07-27 NOTE — Patient Instructions (Signed)
Exercising to Stay Healthy To become healthy and stay healthy, it is recommended that you do moderate-intensity and vigorous-intensity exercise. You can tell that you are exercising at a moderate intensity if your heart starts beating faster and you start breathing faster but can still hold a conversation. You can tell that you are exercising at a vigorous intensity if you are breathing much harder and faster and cannot hold a conversation while exercising. Exercising regularly is important. It has many health benefits, such as:  Improving overall fitness, flexibility, and endurance.  Increasing bone density.  Helping with weight control.  Decreasing body fat.  Increasing muscle strength.  Reducing stress and tension.  Improving overall health. How often should I exercise? Choose an activity that you enjoy, and set realistic goals. Your health care provider can help you make an activity plan that works for you. Exercise regularly as told by your health care provider. This may include:  Doing strength training two times a week, such as: ? Lifting weights. ? Using resistance bands. ? Push-ups. ? Sit-ups. ? Yoga.  Doing a certain intensity of exercise for a given amount of time. Choose from these options: ? A total of 150 minutes of moderate-intensity exercise every week. ? A total of 75 minutes of vigorous-intensity exercise every week. ? A mix of moderate-intensity and vigorous-intensity exercise every week. Children, pregnant women, people who have not exercised regularly, people who are overweight, and older adults may need to talk with a health care provider about what activities are safe to do. If you have a medical condition, be sure to talk with your health care provider before you start a new exercise program. What are some exercise ideas? Moderate-intensity exercise ideas include:  Walking 1 mile (1.6 km) in about 15 minutes.  Biking.  Hiking.  Golfing.  Dancing.   Water aerobics. Vigorous-intensity exercise ideas include:  Walking 4.5 miles (7.2 km) or more in about 1 hour.  Jogging or running 5 miles (8 km) in about 1 hour.  Biking 10 miles (16.1 km) or more in about 1 hour.  Lap swimming.  Roller-skating or in-line skating.  Cross-country skiing.  Vigorous competitive sports, such as football, basketball, and soccer.  Jumping rope.  Aerobic dancing. What are some everyday activities that can help me to get exercise?  Yard work, such as: ? Pushing a lawn mower. ? Raking and bagging leaves.  Washing your car.  Pushing a stroller.  Shoveling snow.  Gardening.  Washing windows or floors. How can I be more active in my day-to-day activities?  Use stairs instead of an elevator.  Take a walk during your lunch break.  If you drive, park your car farther away from your work or school.  If you take public transportation, get off one stop early and walk the rest of the way.  Stand up or walk around during all of your indoor phone calls.  Get up, stretch, and walk around every 30 minutes throughout the day.  Enjoy exercise with a friend. Support to continue exercising will help you keep a regular routine of activity. What guidelines can I follow while exercising?  Before you start a new exercise program, talk with your health care provider.  Do not exercise so much that you hurt yourself, feel dizzy, or get very short of breath.  Wear comfortable clothes and wear shoes with good support.  Drink plenty of water while you exercise to prevent dehydration or heat stroke.  Work out until your breathing   and your heartbeat get faster. Where to find more information  [place] Department of Health and Human Services: www.hhs.gov  Centers for Disease Control and Prevention (CDC): www.cdc.gov Summary  Exercising regularly is important. It will improve your overall fitness, flexibility, and endurance.  Regular exercise also will  improve your overall health. It can help you control your weight, reduce stress, and improve your bone density.  Do not exercise so much that you hurt yourself, feel dizzy, or get very short of breath.  Before you start a new exercise program, talk with your health care provider. This information is not intended to replace advice given to you by your health care provider. Make sure you discuss any questions you have with your health care provider. Document Released: 11/21/2010 Document Revised: 10/01/2017 Document Reviewed: 09/09/2017 Elsevier Patient Education  2020 [employer]  

## 2019-07-28 LAB — CMP14+EGFR
ALT: 8 IU/L (ref 0–32)
AST: 18 IU/L (ref 0–40)
Albumin/Globulin Ratio: 2 (ref 1.2–2.2)
Albumin: 4.6 g/dL (ref 3.8–4.9)
Alkaline Phosphatase: 92 IU/L (ref 39–117)
BUN/Creatinine Ratio: 21 (ref 9–23)
BUN: 23 mg/dL (ref 6–24)
Bilirubin Total: <0.2 mg/dL (ref 0.0–1.2)
CO2: 25 mmol/L (ref 20–29)
Calcium: 9.6 mg/dL (ref 8.7–10.2)
Chloride: 101 mmol/L (ref 96–106)
Creatinine, Ser: 1.07 mg/dL — ABNORMAL HIGH (ref 0.57–1.00)
GFR calc Af Amer: 68 mL/min/{1.73_m2} (ref >59)
GFR calc non Af Amer: 59 mL/min/{1.73_m2} — ABNORMAL LOW (ref >59)
Globulin, Total: 2.3 g/dL (ref 1.5–4.5)
Glucose: 92 mg/dL (ref 65–99)
Potassium: 4.1 mmol/L (ref 3.5–5.2)
Sodium: 138 mmol/L (ref 134–144)
Total Protein: 6.9 g/dL (ref 6.0–8.5)

## 2019-07-28 LAB — CBC WITH DIFFERENTIAL/PLATELET
Basophils Absolute: 0.1 10*3/uL (ref 0.0–0.2)
Basos: 1 %
EOS (ABSOLUTE): 0.1 10*3/uL (ref 0.0–0.4)
Eos: 2 %
Hematocrit: 39.2 % (ref 34.0–46.6)
Hemoglobin: 13.2 g/dL (ref 11.1–15.9)
Immature Grans (Abs): 0 10*3/uL (ref 0.0–0.1)
Immature Granulocytes: 0 %
Lymphocytes Absolute: 1.8 10*3/uL (ref 0.7–3.1)
Lymphs: 37 %
MCH: 30.1 pg (ref 26.6–33.0)
MCHC: 33.7 g/dL (ref 31.5–35.7)
MCV: 89 fL (ref 79–97)
Monocytes Absolute: 0.5 10*3/uL (ref 0.1–0.9)
Monocytes: 9 %
Neutrophils Absolute: 2.5 10*3/uL (ref 1.4–7.0)
Neutrophils: 51 %
Platelets: 243 10*3/uL (ref 150–450)
RBC: 4.39 x10E6/uL (ref 3.77–5.28)
RDW: 12.7 % (ref 11.7–15.4)
WBC: 5 10*3/uL (ref 3.4–10.8)

## 2019-07-28 LAB — LIPID PANEL
Chol/HDL Ratio: 2.9 ratio (ref 0.0–4.4)
Cholesterol, Total: 224 mg/dL — ABNORMAL HIGH (ref 100–199)
HDL: 77 mg/dL (ref >39)
LDL Chol Calc (NIH): 136 mg/dL — ABNORMAL HIGH (ref 0–99)
Triglycerides: 66 mg/dL (ref 0–149)
VLDL Cholesterol Cal: 11 mg/dL (ref 5–40)

## 2019-07-28 LAB — THYROID PANEL WITH TSH
Free Thyroxine Index: 2.1 (ref 1.2–4.9)
T3 Uptake Ratio: 28 % (ref 24–39)
T4, Total: 7.5 ug/dL (ref 4.5–12.0)
TSH: 2.78 u[IU]/mL (ref 0.450–4.500)

## 2019-07-28 LAB — HEPATITIS C ANTIBODY: Hep C Virus Ab: <0.1 s/co ratio (ref 0.0–0.9)

## 2019-07-31 ENCOUNTER — Ambulatory Visit: Payer: BLUE CROSS/BLUE SHIELD | Admitting: Nurse Practitioner

## 2019-08-01 LAB — IGP, APTIMA HPV, RFX 16/18,45: HPV Aptima: NEGATIVE

## 2019-09-01 ENCOUNTER — Other Ambulatory Visit: Payer: Self-pay | Admitting: Nurse Practitioner

## 2019-09-01 DIAGNOSIS — B009 Herpesviral infection, unspecified: Secondary | ICD-10-CM

## 2019-09-10 ENCOUNTER — Other Ambulatory Visit: Payer: Self-pay | Admitting: Nurse Practitioner

## 2019-09-10 DIAGNOSIS — K219 Gastro-esophageal reflux disease without esophagitis: Secondary | ICD-10-CM

## 2020-05-16 ENCOUNTER — Other Ambulatory Visit: Payer: Self-pay | Admitting: Nurse Practitioner

## 2020-05-16 DIAGNOSIS — Z1231 Encounter for screening mammogram for malignant neoplasm of breast: Secondary | ICD-10-CM

## 2020-05-17 ENCOUNTER — Ambulatory Visit
Admission: RE | Admit: 2020-05-17 | Discharge: 2020-05-17 | Disposition: A | Discharge status: Home / Self Care | Payer: No Typology Code available for payment source | Source: Ambulatory Visit

## 2020-05-17 ENCOUNTER — Other Ambulatory Visit: Payer: Self-pay

## 2020-05-17 DIAGNOSIS — Z1231 Encounter for screening mammogram for malignant neoplasm of breast: Secondary | ICD-10-CM

## 2020-07-29 ENCOUNTER — Telehealth: Payer: Self-pay | Admitting: Nurse Practitioner

## 2020-07-29 DIAGNOSIS — K219 Gastro-esophageal reflux disease without esophagitis: Secondary | ICD-10-CM

## 2020-07-29 MED ORDER — OMEPRAZOLE 40 MG PO CPDR: 40.0000 mg | DELAYED_RELEASE_CAPSULE | Freq: Every day | ORAL | 0 refills | Status: DC

## 2020-07-29 NOTE — Telephone Encounter (Signed)
Refill sent to pharmacy.  Patient made aware to keep appointment

## 2020-07-29 NOTE — Telephone Encounter (Signed)
°  Prescription Request  07/29/2020  What is the name of the medication or equipment? Omeprazole  Have you contacted your pharmacy to request a refill? (if applicable) Yes  Which pharmacy would you like this sent to? [facility]  Pt scheduled appt to see MMM for med refill on 08/26/20 (first available appt slot) but says she will run out of her omeprazole before then. Requesting that MMM send in enough to last her until her appt. Can send to [facility].    Patient notified that their request is being sent to the clinical staff for review and that they should receive a response within 2 business days.

## 2020-08-26 ENCOUNTER — Ambulatory Visit (INDEPENDENT_AMBULATORY_CARE_PROVIDER_SITE_OTHER): Payer: No Typology Code available for payment source

## 2020-08-26 ENCOUNTER — Ambulatory Visit: Payer: BC Managed Care – PPO | Admitting: Nurse Practitioner

## 2020-08-26 ENCOUNTER — Other Ambulatory Visit: Payer: Self-pay

## 2020-08-26 ENCOUNTER — Encounter: Payer: Self-pay | Admitting: Nurse Practitioner

## 2020-08-26 VITALS — BP 115/72 | HR 63 | Temp 97.6°F | Resp 20 | Ht 63.0 in | Wt 179.0 lb

## 2020-08-26 DIAGNOSIS — M25552 Pain in left hip: Secondary | ICD-10-CM | POA: Diagnosis not present

## 2020-08-26 DIAGNOSIS — Z6829 Body mass index (BMI) 29.0-29.9, adult: Secondary | ICD-10-CM | POA: Diagnosis not present

## 2020-08-26 DIAGNOSIS — B009 Herpesviral infection, unspecified: Secondary | ICD-10-CM

## 2020-08-26 DIAGNOSIS — Z23 Encounter for immunization: Secondary | ICD-10-CM | POA: Diagnosis not present

## 2020-08-26 DIAGNOSIS — B070 Plantar wart: Secondary | ICD-10-CM | POA: Diagnosis not present

## 2020-08-26 DIAGNOSIS — Z0000 Encounter for general adult medical examination without abnormal findings: Secondary | ICD-10-CM

## 2020-08-26 DIAGNOSIS — K219 Gastro-esophageal reflux disease without esophagitis: Secondary | ICD-10-CM | POA: Diagnosis not present

## 2020-08-26 MED ORDER — METHYLPREDNISOLONE ACETATE 80 MG/ML IJ SUSP: 80.0000 mg | Freq: Once | INTRAMUSCULAR | Status: DC

## 2020-08-26 MED ORDER — METHYLPREDNISOLONE ACETATE 40 MG/ML IJ SUSP
40.0000 mg | Freq: Once | INTRAMUSCULAR | Status: AC
  Administered 2020-08-26: 40 mg via INTRAMUSCULAR

## 2020-08-26 MED ORDER — VALACYCLOVIR HCL 1 G PO TABS: 1000.0000 mg | ORAL_TABLET | Freq: Every day | ORAL | 1 refills | Status: DC

## 2020-08-26 MED ORDER — PREDNISONE 20 MG PO TABS: ORAL_TABLET | ORAL | 0 refills | Status: DC

## 2020-08-26 MED ORDER — OMEPRAZOLE 40 MG PO CPDR: 40.0000 mg | DELAYED_RELEASE_CAPSULE | Freq: Every day | ORAL | 1 refills | Status: DC

## 2020-08-26 NOTE — Progress Notes (Signed)
Subjective:    Patient ID: Hannah Reese, female    DOB: June 01, 1961, 59 y.o.   MRN: [id number]  Chief Complaint: Annual Physical Exam   HPI:  1. Gastroesophageal reflux disease without esophagitis Takes medication as prescribed. Avoids trigger foods, watches diet.   2. BMI 29.0-29.9,adult Walks on treadmill every morning. Does not watch diet.     Outpatient Encounter Medications as of 08/26/2020  Medication Sig   Cholecalciferol (VITAMIN D) 1000 UNITS capsule Take 1,000 Units by mouth daily.   cyclobenzaprine (FLEXERIL) 5 MG tablet Take 1 tablet (5 mg total) by mouth 3 (three) times daily as needed for muscle spasms.   ibuprofen (ADVIL,MOTRIN) 800 MG tablet Take 1 tablet (800 mg total) by mouth every 8 (eight) hours as needed for pain.   Naltrexone-buPROPion HCl ER (CONTRAVE) 8-90 MG TB12 1 tablet Po Qam x7d ; then 1 tab po BID x 7 d; then 2 tab in AM and 1 in PM X7d; Then 2 po BID from then on   omeprazole (PRILOSEC) 40 MG capsule Take 1 capsule (40 mg total) by mouth daily.   valACYclovir (VALTREX) 1000 MG tablet TAKE 1 TABLET DAILY   No facility-administered encounter medications on file as of 08/26/2020.    Past Surgical History:  Procedure Laterality Date   ABLATION  2012   AUGMENTATION MAMMAPLASTY Bilateral    BLADDER SUSPENSION  06/12/2011   Procedure: TRANSVAGINAL TAPE (TVT) PROCEDURE;  Surgeon: Sharene Butters;  Location: [facility];  Service: Gynecology;  Laterality: N/A;  Transobturator   BREAST SURGERY     TUBAL LIGATION      Family History  Problem Relation Age of Onset   Alzheimer's disease Mother    Hypertension Father    Heart disease Father    Cancer Father    Healthy Sister    Early death Brother    Healthy Sister    Colon cancer Neg Hx    Breast cancer Neg Hx     New complaints: Wart on right greater toe, has tried OTC wart meds without relief.  Left hip pain that comes and goes, sometimes keeps her awake at night, tylenol  with relief.   Social history: Lives at home with boyfriend, [employer] out to dinner, concerts, camping for fun.   Controlled substance contract: na     Review of Systems  Constitutional: Negative.   HENT: Negative.   Eyes: Negative.   Respiratory: Negative.   Cardiovascular: Negative.   Gastrointestinal: Negative.   Endocrine: Negative.   Genitourinary: Negative.   Musculoskeletal: Negative.        Left hip pain  Skin: Negative.   Allergic/Immunologic: Negative.   Neurological: Negative.   Hematological: Negative.   Psychiatric/Behavioral: Negative.   All other systems reviewed and are negative.      Objective:   Physical Exam Vitals and nursing note reviewed.  Constitutional:      Appearance: Normal appearance.  HENT:     Head: Normocephalic and atraumatic.     Right Ear: Tympanic membrane, ear canal and external ear normal.     Left Ear: Tympanic membrane, ear canal and external ear normal.     Nose: Nose normal.     Mouth/Throat:     Mouth: Mucous membranes are moist.     Pharynx: Oropharynx is clear.  Eyes:     Extraocular Movements: Extraocular movements intact.     Conjunctiva/sclera: Conjunctivae normal.     Pupils: Pupils are equal, round, and reactive to light.  Cardiovascular:     Rate and Rhythm: Normal rate and regular rhythm.     Pulses: Normal pulses.     Heart sounds: Normal heart sounds.  Pulmonary:     Effort: Pulmonary effort is normal.     Breath sounds: Normal breath sounds.  Abdominal:     General: Abdomen is flat. Bowel sounds are normal.     Palpations: Abdomen is soft.  Musculoskeletal:     Cervical back: Normal range of motion.       Feet:  Feet:     Comments: Planter wart ~1 year Skin:    General: Skin is warm and dry.     Capillary Refill: Capillary refill takes less than 2 seconds.  Neurological:     General: No focal deficit present.     Mental Status: She is alert and oriented to person, place, and time. Mental status is at  baseline.  Psychiatric:        Mood and Affect: Mood normal.        Behavior: Behavior normal.        Thought Content: Thought content normal.        Judgment: Judgment normal.    BP 115/72    Pulse 63    Temp 97.6 F (36.4 C) (Temporal)    Resp 20    Ht $R'5\' 3"'hC$  (1.6 m)    Wt 179 lb (81.2 kg)    SpO2 98%    BMI 31.71 kg/m   Left hip xray- mild osteoarthritis-Preliminary reading by Ronnald Collum, FNP  [facility]      Assessment & Plan:  Hannah Reese comes in today with chief complaint of Medical Management of Chronic Issues (? wart on foot)   Diagnosis and orders addressed:  1. Gastroesophageal reflux disease without esophagitis Avoid spicy foods Do not eat 2 hours prior to bedtime  - omeprazole (PRILOSEC) 40 MG capsule; Take 1 capsule (40 mg total) by mouth daily.  Dispense: 90 capsule; Refill: 1  2. BMI 29.0-29.9,adult Discussed diet and exercise for person with BMI >25 Will recheck weight in 3-6 months  3. Annual physical exam - CBC with Differential/Platelet - CMP14+EGFR - Lipid panel - TSH  4. Hip pain, acute, left Moist heat rest - DG HIP UNILAT W OR W/O PELVIS 2-3 VIEWS LEFT - methylPREDNISolone acetate (DEPO-MEDROL) injection 80 mg - predniSONE (DELTASONE) 20 MG tablet; 2 po at sametime daily for 5 days- start tomorrow  Dispense: 10 tablet; Refill: 0  5. Plantar wart Referral to poditrist - Ambulatory referral to Podiatry  6. HSV-1 (herpes simplex virus 1) infection - valACYclovir (VALTREX) 1000 MG tablet; Take 1 tablet (1,000 mg total) by mouth daily.  Dispense: 90 tablet; Refill: 1   Labs pending Health Maintenance reviewed Diet and exercise encouraged  Follow up plan: 6 months   Mary-Margaret Hassell Done, FNP

## 2020-08-26 NOTE — Patient Instructions (Signed)

## 2020-08-27 LAB — CBC WITH DIFFERENTIAL/PLATELET
Basophils Absolute: 0.1 10*3/uL (ref 0.0–0.2)
Basos: 1 %
EOS (ABSOLUTE): 0.1 10*3/uL (ref 0.0–0.4)
Eos: 1 %
Hematocrit: 40 % (ref 34.0–46.6)
Hemoglobin: 13.6 g/dL (ref 11.1–15.9)
Immature Grans (Abs): 0 10*3/uL (ref 0.0–0.1)
Immature Granulocytes: 0 %
Lymphocytes Absolute: 2.9 10*3/uL (ref 0.7–3.1)
Lymphs: 37 %
MCH: 31.3 pg (ref 26.6–33.0)
MCHC: 34 g/dL (ref 31.5–35.7)
MCV: 92 fL (ref 79–97)
Monocytes Absolute: 0.4 10*3/uL (ref 0.1–0.9)
Monocytes: 6 %
Neutrophils Absolute: 4.3 10*3/uL (ref 1.4–7.0)
Neutrophils: 55 %
Platelets: 254 10*3/uL (ref 150–450)
RBC: 4.35 x10E6/uL (ref 3.77–5.28)
RDW: 12.8 % (ref 11.7–15.4)
WBC: 7.8 10*3/uL (ref 3.4–10.8)

## 2020-08-27 LAB — CMP14+EGFR
ALT: 10 IU/L (ref 0–32)
AST: 15 IU/L (ref 0–40)
Albumin/Globulin Ratio: 1.9 (ref 1.2–2.2)
Albumin: 4.6 g/dL (ref 3.8–4.9)
Alkaline Phosphatase: 90 IU/L (ref 44–121)
BUN/Creatinine Ratio: 23 (ref 9–23)
BUN: 18 mg/dL (ref 6–24)
Bilirubin Total: <0.2 mg/dL (ref 0.0–1.2)
CO2: 28 mmol/L (ref 20–29)
Calcium: 10 mg/dL (ref 8.7–10.2)
Chloride: 101 mmol/L (ref 96–106)
Creatinine, Ser: 0.79 mg/dL (ref 0.57–1.00)
GFR calc Af Amer: 97 mL/min/{1.73_m2} (ref >59)
GFR calc non Af Amer: 84 mL/min/{1.73_m2} (ref >59)
Globulin, Total: 2.4 g/dL (ref 1.5–4.5)
Glucose: 100 mg/dL — ABNORMAL HIGH (ref 65–99)
Potassium: 4.2 mmol/L (ref 3.5–5.2)
Sodium: 144 mmol/L (ref 134–144)
Total Protein: 7 g/dL (ref 6.0–8.5)

## 2020-08-27 LAB — LIPID PANEL
Chol/HDL Ratio: 3.7 ratio (ref 0.0–4.4)
Cholesterol, Total: 249 mg/dL — ABNORMAL HIGH (ref 100–199)
HDL: 68 mg/dL (ref >39)
LDL Chol Calc (NIH): 150 mg/dL — ABNORMAL HIGH (ref 0–99)
Triglycerides: 172 mg/dL — ABNORMAL HIGH (ref 0–149)
VLDL Cholesterol Cal: 31 mg/dL (ref 5–40)

## 2020-08-27 LAB — TSH: TSH: 1.14 u[IU]/mL (ref 0.450–4.500)

## 2020-12-13 ENCOUNTER — Encounter: Payer: Self-pay | Admitting: Family Medicine

## 2020-12-13 ENCOUNTER — Ambulatory Visit (INDEPENDENT_AMBULATORY_CARE_PROVIDER_SITE_OTHER): Payer: No Typology Code available for payment source | Admitting: Family Medicine

## 2020-12-13 DIAGNOSIS — J0100 Acute maxillary sinusitis, unspecified: Secondary | ICD-10-CM | POA: Diagnosis not present

## 2020-12-13 MED ORDER — AMOXICILLIN-POT CLAVULANATE 875-125 MG PO TABS: 1.0000 | ORAL_TABLET | Freq: Two times a day (BID) | ORAL | 0 refills | Status: AC

## 2020-12-13 MED ORDER — PREDNISONE 10 MG (21) PO TBPK: ORAL_TABLET | ORAL | 0 refills | Status: DC

## 2020-12-13 NOTE — Progress Notes (Signed)
Virtual Visit via Telephone Note  I connected with Hannah Reese on 12/13/20 at 8:16 AM by telephone and verified that I am speaking with the correct person using two identifiers. Hannah Reese is currently located at home and nobody is currently with her during this visit. The provider, Gwenlyn Fudge, FNP is located in their office at time of visit.  I discussed the limitations, risks, security and privacy concerns of performing an evaluation and management service by telephone and the availability of in person appointments. I also discussed with the patient that there may be a patient responsible charge related to this service. The patient expressed understanding and agreed to proceed.  Subjective: PCP: Bennie Pierini, FNP  Chief Complaint  Patient presents with  . URI   Patient complains of head congestion, headache, runny nose, sneezing, facial pain/pressure and postnasal drainage. Onset of symptoms was 5 days ago, unchanged since that time. She is drinking plenty of fluids. Evaluation to date: none. Treatment to date: Afrin, decongestant, Dayquil/Nyquil. She does not smoke.  Patient has had the flu shot this year.  She had the first 2 COVID-19 vaccines, but has not yet had her booster.  She reports she had COVID-19 about a month ago.   ROS: Per HPI  Current Outpatient Medications:  .  Cholecalciferol (VITAMIN D) 1000 UNITS capsule, Take 1,000 Units by mouth daily., Disp: , Rfl:  .  cyclobenzaprine (FLEXERIL) 5 MG tablet, Take 1 tablet (5 mg total) by mouth 3 (three) times daily as needed for muscle spasms., Disp: 30 tablet, Rfl: 1 .  ibuprofen (ADVIL,MOTRIN) 800 MG tablet, Take 1 tablet (800 mg total) by mouth every 8 (eight) hours as needed for pain., Disp: 60 tablet, Rfl: 1 .  omeprazole (PRILOSEC) 40 MG capsule, Take 1 capsule (40 mg total) by mouth daily., Disp: 90 capsule, Rfl: 1 .  valACYclovir (VALTREX) 1000 MG tablet, Take 1 tablet (1,000 mg total) by mouth daily.,  Disp: 90 tablet, Rfl: 1  No Known Allergies Past Medical History:  Diagnosis Date  . GERD (gastroesophageal reflux disease)   . Recurrent cold sores     Observations/Objective: A&O  No respiratory distress or wheezing audible over the phone Mood, judgement, and thought processes all WNL  Assessment and Plan: 1. Acute non-recurrent maxillary sinusitis Discussed symptom management.  Patient declines testing for COVID-19. - amoxicillin-clavulanate (AUGMENTIN) 875-125 MG tablet; Take 1 tablet by mouth 2 (two) times daily for 7 days.  Dispense: 14 tablet; Refill: 0 - predniSONE (STERAPRED UNI-PAK 21 TAB) 10 MG (21) TBPK tablet; As directed x 6 days  Dispense: 21 tablet; Refill: 0   Follow Up Instructions:  I discussed the assessment and treatment plan with the patient. The patient was provided an opportunity to ask questions and all were answered. The patient agreed with the plan and demonstrated an understanding of the instructions.   The patient was advised to call back or seek an in-person evaluation if the symptoms worsen or if the condition fails to improve as anticipated.  The above assessment and management plan was discussed with the patient. The patient verbalized understanding of and has agreed to the management plan. Patient is aware to call the clinic if symptoms persist or worsen. Patient is aware when to return to the clinic for a follow-up visit. Patient educated on when it is appropriate to go to the emergency department.   Time call ended: 8:22 AM  I provided 6 minutes of non-face-to-face time during this encounter.  Deliah Boston, MSN, APRN, FNP-C [facility] 12/13/20

## 2021-01-09 ENCOUNTER — Encounter: Payer: Self-pay | Admitting: Nurse Practitioner

## 2021-01-09 ENCOUNTER — Ambulatory Visit: Payer: No Typology Code available for payment source | Admitting: Nurse Practitioner

## 2021-01-09 DIAGNOSIS — K219 Gastro-esophageal reflux disease without esophagitis: Secondary | ICD-10-CM | POA: Diagnosis not present

## 2021-01-09 DIAGNOSIS — K591 Functional diarrhea: Secondary | ICD-10-CM

## 2021-01-09 MED ORDER — CIPROFLOXACIN HCL 500 MG PO TABS: 500.0000 mg | ORAL_TABLET | Freq: Two times a day (BID) | ORAL | 0 refills | Status: DC

## 2021-01-09 MED ORDER — OMEPRAZOLE 40 MG PO CPDR: 40.0000 mg | DELAYED_RELEASE_CAPSULE | Freq: Every day | ORAL | 1 refills | Status: DC

## 2021-01-09 NOTE — Progress Notes (Signed)
Virtual Visit via telephone Note Due to COVID-19 pandemic this visit was conducted virtually. This visit type was conducted due to national recommendations for restrictions regarding the COVID-19 Pandemic (e.g. social distancing, sheltering in place) in an effort to limit this patient's exposure and mitigate transmission in our community. All issues noted in this document were discussed and addressed.  A physical exam was not performed with this format.  I connected with Hannah Reese on 01/09/21 at 8:50 by telephone and verified that I am speaking with the correct person using two identifiers. Hannah Reese is currently located at home and no one is currently with her during visit. The provider, Mary-Margaret Daphine Deutscher, FNP is located in their office at time of visit.  I discussed the limitations, risks, security and privacy concerns of performing an evaluation and management service by telephone and the availability of in person appointments. I also discussed with the patient that there may be a patient responsible charge related to this service. The patient expressed understanding and agreed to proceed.   History and Present Illness:   Chief Complaint: diarrhea  HPI Patient developed diarrhea over the last 2 days and has had nausea and vomiting. Has not thrown up since yesterday. She has been taking imodium and that has not seemed to help. She went over 15 times yesterday and has gone several times this morning.    Review of Systems  Constitutional: Negative for chills and fever.  Gastrointestinal: Positive for diarrhea, nausea and vomiting. Negative for abdominal pain, blood in stool, constipation and melena.  Neurological: Positive for loss of consciousness.  Psychiatric/Behavioral: Negative.      Observations/Objective: Alert and oriented- answers all questions appropriately No distress    Assessment and Plan: Hannah Reese in today with chief complaint of No chief  complaint on file.   1. Gastroesophageal reflux disease without esophagitis Just refilled meds today - omeprazole (PRILOSEC) 40 MG capsule; Take 1 capsule (40 mg total) by mouth daily.  Dispense: 90 capsule; Refill: 1  2. Functional diarrhea First 24 Hours-Clear liquids  popsicles  Jello  gatorade  Sprite Second 24 hours-Add Full liquids ( Liquids you cant see through) Third 24 hours- Bland diet ( foods that are baked or broiled)  *avoiding fried foods and highly spiced foods* During these 3 days  Avoid milk, cheese, ice cream or any other dairy products  Avoid caffeine- REMEMBER Mt. Dew and Mello Yellow contain lots of caffeine You should eat and drink in  Frequent small volumes If no improvement in symptoms or worsen in 2-3 days should RETRUN TO OFFICE or go to ER!     - ciprofloxacin (CIPRO) 500 MG tablet; Take 1 tablet (500 mg total) by mouth 2 (two) times daily.  Dispense: 14 tablet; Refill: 0     Follow Up Instructions: Next week to discuss weight    I discussed the assessment and treatment plan with the patient. The patient was provided an opportunity to ask questions and all were answered. The patient agreed with the plan and demonstrated an understanding of the instructions.   The patient was advised to call back or seek an in-person evaluation if the symptoms worsen or if the condition fails to improve as anticipated.  The above assessment and management plan was discussed with the patient. The patient verbalized understanding of and has agreed to the management plan. Patient is aware to call the clinic if symptoms persist or worsen. Patient is aware when to return to the  clinic for a follow-up visit. Patient educated on when it is appropriate to go to the emergency department.   Time call ended:  9:02  I provided  12 minutes of non-face-to-face time during this encounter.    Mary-Margaret Daphine Deutscher, FNP

## 2021-01-16 ENCOUNTER — Ambulatory Visit: Payer: No Typology Code available for payment source | Admitting: Nurse Practitioner

## 2021-01-16 ENCOUNTER — Other Ambulatory Visit: Payer: Self-pay

## 2021-01-16 ENCOUNTER — Encounter: Payer: Self-pay | Admitting: Nurse Practitioner

## 2021-01-16 VITALS — BP 116/72 | HR 61 | Temp 96.9°F | Resp 20 | Ht 63.0 in | Wt 181.6 lb

## 2021-01-16 DIAGNOSIS — Z6832 Body mass index (BMI) 32.0-32.9, adult: Secondary | ICD-10-CM

## 2021-01-16 DIAGNOSIS — K219 Gastro-esophageal reflux disease without esophagitis: Secondary | ICD-10-CM | POA: Diagnosis not present

## 2021-01-16 DIAGNOSIS — E8881 Metabolic syndrome: Secondary | ICD-10-CM

## 2021-01-16 MED ORDER — OZEMPIC (1 MG/DOSE) 2 MG/1.5ML ~~LOC~~ SOPN
1.0000 mg | PEN_INJECTOR | SUBCUTANEOUS | 5 refills | Status: DC
Start: 2021-01-16 — End: 2021-09-11

## 2021-01-16 NOTE — Progress Notes (Signed)
Subjective:    Patient ID: Hannah Reese, female    DOB: 03/10/1961, 60 y.o.   MRN: [id number]   Chief Complaint: Medical Management of Chronic Issues (Discuss weight loss)    HPI:  1. Gastroesophageal reflux disease without esophagitis Is on omeprazole daily and if she does not take it she will get symptoms.  2. BMI 29.0-29.9,adult Weight is up 2 lbs from previous Wt Readings from Last 3 Encounters:  01/16/21 181 lb 9.6 oz (82.4 kg)  08/26/20 179 lb (81.2 kg)  07/27/19 175 lb (79.4 kg)    BMI Readings from Last 3 Encounters:  01/16/21 32.17 kg/m  08/26/20 31.71 kg/m  07/27/19 31.00 kg/m    3. metabolic syndrome Fasting blood sugars are gradually increasing.  Outpatient Encounter Medications as of 01/16/2021  Medication Sig  . B Complex Vitamins (B-COMPLEX/B-12) TABS   . Cholecalciferol (VITAMIN D) 1000 UNITS capsule Take 1,000 Units by mouth daily.  . cyclobenzaprine (FLEXERIL) 5 MG tablet Take 1 tablet (5 mg total) by mouth 3 (three) times daily as needed for muscle spasms.  Marland Kitchen ibuprofen (ADVIL,MOTRIN) 800 MG tablet Take 1 tablet (800 mg total) by mouth every 8 (eight) hours as needed for pain.  . Loratadine 10 MG CAPS   . Multiple Vitamins-Minerals (ICAPS AREDS 2 PO)   . omeprazole (PRILOSEC) 40 MG capsule Take 1 capsule (40 mg total) by mouth daily.  . valACYclovir (VALTREX) 1000 MG tablet Take 1 tablet (1,000 mg total) by mouth daily.  . [DISCONTINUED] ciprofloxacin (CIPRO) 500 MG tablet Take 1 tablet (500 mg total) by mouth 2 (two) times daily.   No facility-administered encounter medications on file as of 01/16/2021.    Past Surgical History:  Procedure Laterality Date  . ABLATION  2012  . AUGMENTATION MAMMAPLASTY Bilateral   . BLADDER SUSPENSION  06/12/2011   Procedure: TRANSVAGINAL TAPE (TVT) PROCEDURE;  Surgeon: Sharene Butters;  Location: [facility];  Service: Gynecology;  Laterality: N/A;  Transobturator  . BREAST SURGERY    . TUBAL LIGATION       Family History  Problem Relation Age of Onset  . Alzheimer's disease Mother   . Hypertension Father   . Heart disease Father   . Cancer Father   . Healthy Sister   . Early death Brother   . Healthy Sister   . Colon cancer Neg Hx   . Breast cancer Neg Hx     New complaints: None daily  Social history: Loves with her boyfrined  Controlled substance contract: n/a    Review of Systems  Constitutional: Negative for diaphoresis.  Eyes: Negative for pain.  Respiratory: Negative for shortness of breath.   Cardiovascular: Negative for chest pain, palpitations and leg swelling.  Gastrointestinal: Negative for abdominal pain.  Endocrine: Negative for polydipsia.  Skin: Negative for rash.  Neurological: Negative for dizziness, weakness and headaches.  Hematological: Does not bruise/bleed easily.  All other systems reviewed and are negative.      Objective:   Physical Exam Vitals and nursing note reviewed.  Constitutional:      General: She is not in acute distress.    Appearance: Normal appearance. She is well-developed.  HENT:     Head: Normocephalic.     Nose: Nose normal.  Eyes:     Pupils: Pupils are equal, round, and reactive to light.  Neck:     Vascular: No carotid bruit or JVD.  Cardiovascular:     Rate and Rhythm: Normal rate and regular rhythm.  Heart sounds: Normal heart sounds.  Pulmonary:     Effort: Pulmonary effort is normal. No respiratory distress.     Breath sounds: Normal breath sounds. No wheezing or rales.  Chest:     Chest wall: No tenderness.  Abdominal:     General: Bowel sounds are normal. There is no distension or abdominal bruit.     Palpations: Abdomen is soft. There is no hepatomegaly, splenomegaly, mass or pulsatile mass.     Tenderness: There is no abdominal tenderness.  Musculoskeletal:        General: Normal range of motion.     Cervical back: Normal range of motion and neck supple.  Lymphadenopathy:     Cervical: No  cervical adenopathy.  Skin:    General: Skin is warm and dry.  Neurological:     Mental Status: She is alert and oriented to person, place, and time.     Deep Tendon Reflexes: Reflexes are normal and symmetric.  Psychiatric:        Behavior: Behavior normal.        Thought Content: Thought content normal.        Judgment: Judgment normal.     BP 116/72   Pulse 61   Temp (!) 96.9 F (36.1 C) (Temporal)   Resp 20   Ht '5\' 3"'  (1.6 m)   Wt 181 lb 9.6 oz (82.4 kg)   SpO2 100%   BMI 32.17 kg/m       Assessment & Plan:  Hannah Reese comes in today with chief complaint of Medical Management of Chronic Issues (Discuss weight loss)   Diagnosis and orders addressed:  1. Gastroesophageal reflux disease without esophagitis Avoid spicy foods Do not eat 2 hours prior to bedtime   2. BMI 32.0-32.9,adult Discussed diet and exercise for person with BMI >25 Will recheck weight in 3-6 months - CBC with Differential/Platelet - Lipid panel  3. Metabolic syndrome Samples of ozempic and use discussed Prescription sent in to see if insurance will cover. - CMP14+EGFR   Labs pending Health Maintenance reviewed Diet and exercise encouraged  Follow up plan: 3 months   Mary-Margaret Hassell Done, FNP

## 2021-01-17 LAB — CMP14+EGFR
ALT: 14 IU/L (ref 0–32)
AST: 18 IU/L (ref 0–40)
Albumin/Globulin Ratio: 1.8 (ref 1.2–2.2)
Albumin: 4.6 g/dL (ref 3.8–4.9)
Alkaline Phosphatase: 91 IU/L (ref 44–121)
BUN/Creatinine Ratio: 22 (ref 9–23)
BUN: 19 mg/dL (ref 6–24)
Bilirubin Total: <0.2 mg/dL (ref 0.0–1.2)
CO2: 25 mmol/L (ref 20–29)
Calcium: 9.6 mg/dL (ref 8.7–10.2)
Chloride: 101 mmol/L (ref 96–106)
Creatinine, Ser: 0.88 mg/dL (ref 0.57–1.00)
Globulin, Total: 2.6 g/dL (ref 1.5–4.5)
Glucose: 72 mg/dL (ref 65–99)
Potassium: 4.7 mmol/L (ref 3.5–5.2)
Sodium: 142 mmol/L (ref 134–144)
Total Protein: 7.2 g/dL (ref 6.0–8.5)
eGFR: 77 mL/min/{1.73_m2} (ref >59)

## 2021-01-17 LAB — CBC WITH DIFFERENTIAL/PLATELET
Basophils Absolute: 0.1 10*3/uL (ref 0.0–0.2)
Basos: 1 %
EOS (ABSOLUTE): 0.1 10*3/uL (ref 0.0–0.4)
Eos: 1 %
Hematocrit: 39.1 % (ref 34.0–46.6)
Hemoglobin: 13.3 g/dL (ref 11.1–15.9)
Immature Grans (Abs): 0 10*3/uL (ref 0.0–0.1)
Immature Granulocytes: 1 %
Lymphocytes Absolute: 2.5 10*3/uL (ref 0.7–3.1)
Lymphs: 37 %
MCH: 31.1 pg (ref 26.6–33.0)
MCHC: 34 g/dL (ref 31.5–35.7)
MCV: 91 fL (ref 79–97)
Monocytes Absolute: 0.5 10*3/uL (ref 0.1–0.9)
Monocytes: 7 %
Neutrophils Absolute: 3.5 10*3/uL (ref 1.4–7.0)
Neutrophils: 53 %
Platelets: 320 10*3/uL (ref 150–450)
RBC: 4.28 x10E6/uL (ref 3.77–5.28)
RDW: 12.7 % (ref 11.7–15.4)
WBC: 6.6 10*3/uL (ref 3.4–10.8)

## 2021-01-17 LAB — LIPID PANEL
Chol/HDL Ratio: 3.7 ratio (ref 0.0–4.4)
Cholesterol, Total: 194 mg/dL (ref 100–199)
HDL: 52 mg/dL (ref >39)
LDL Chol Calc (NIH): 127 mg/dL — ABNORMAL HIGH (ref 0–99)
Triglycerides: 80 mg/dL (ref 0–149)
VLDL Cholesterol Cal: 15 mg/dL (ref 5–40)

## 2021-02-25 ENCOUNTER — Telehealth: Payer: Self-pay | Admitting: *Deleted

## 2021-02-25 DIAGNOSIS — Z6832 Body mass index (BMI) 32.0-32.9, adult: Secondary | ICD-10-CM

## 2021-02-25 NOTE — Telephone Encounter (Signed)
Key: [id number] Ozempic (1 MG/DOSE) 4MG /3ML pen-injectors Sent to plan   Your PA request has been approved. called

## 2021-06-16 ENCOUNTER — Encounter: Payer: Self-pay | Admitting: Family Medicine

## 2021-06-16 ENCOUNTER — Ambulatory Visit: Payer: No Typology Code available for payment source | Admitting: Nurse Practitioner

## 2021-06-16 ENCOUNTER — Telehealth: Payer: No Typology Code available for payment source | Admitting: Family Medicine

## 2021-06-16 DIAGNOSIS — U071 COVID-19: Secondary | ICD-10-CM | POA: Diagnosis not present

## 2021-06-16 DIAGNOSIS — J029 Acute pharyngitis, unspecified: Secondary | ICD-10-CM

## 2021-06-16 NOTE — Patient Instructions (Signed)
I appreciate the opportunity to provide you with care for your health and wellness.  Please continue to practice social distancing to keep you, your family, and our community safe.  If you must go out, please wear a mask and practice good handwashing.  Have a wonderful day. With Gratitude, Aleeha Boline, DNP, AGNP-BC   To keep from spreading the disease you should: Stay home and limit contact with other people as much as possible. Wash your hands frequently. Cover your coughs and sneezes with a tissue, and throw used tissues in the trash.   Clean and disinfect frequently touched surfaces and objects.     Take care of yourself by: Staying home Resting Drinking fluids Take fever-reducing medications (Tylenol/Acetaminophen and Ibuprofen)   For more information on the disease go to the Centers for Disease Control and Prevention website        Can take to lessen severity: Vit C 500mg twice daily Quercertin 250-500mg twice daily Zinc 75-100mg daily Melatonin 3-6 mg at bedtime Vit D3 1000-2000 IU daily Aspirin 81 mg daily with food Optional: Famotidine 20mg daily Also can add tylenol/ibuprofen as needed for fevers and body aches May add Mucinex or Mucinex DM as needed for cough/congestion     10 Things You Can Do to Manage Your COVID-19 Symptoms at Home If you have possible or confirmed COVID-19: Stay home except to get medical care. Monitor your symptoms carefully. If your symptoms get worse, call your healthcare provider immediately. Get rest and stay hydrated. If you have a medical appointment, call the healthcare provider ahead of time and tell them that you have or may have COVID-19. For medical emergencies, call 911 and notify the dispatch personnel that you have or may have COVID-19. Cover your cough and sneezes with a tissue or use the inside of your elbow. Wash your hands often with soap and water for at least 20 seconds or clean your hands with an alcohol-based hand  sanitizer that contains at least 60% alcohol. As much as possible, stay in a specific room and away from other people in your home. Also, you should use a separate bathroom, if available. If you need to be around other people in or outside of the home, wear a mask. Avoid sharing personal items with other people in your household, like dishes, towels, and bedding. Clean all surfaces that are touched often, like counters, tabletops, and doorknobs. Use household cleaning sprays or wipes according to the label instructions. cdc.gov/coronavirus 05/17/2020 This information is not intended to replace advice given to you by your health care provider. Make sure you discuss any questions you have with your health care provider. Document Revised: 09/02/2020 Document Reviewed: 09/02/2020 Elsevier Patient Education  2021 [employer]  

## 2021-06-16 NOTE — Progress Notes (Signed)
Hannah Reese, Hannah Reese are scheduled for a virtual visit with your provider today.    Just as we do with appointments in the office, we must obtain your consent to participate.  Your consent will be active for this visit and any virtual visit you may have with one of our providers in the next 365 days.    If you have a MyChart account, I can also send a copy of this consent to you electronically.  All virtual visits are billed to your insurance company just like a traditional visit in the office.  As this is a virtual visit, video technology does not allow for your provider to perform a traditional examination.  This may limit your provider's ability to fully assess your condition.  If your provider identifies any concerns that need to be evaluated in person or the need to arrange testing such as labs, EKG, etc, we will make arrangements to do so.    Although advances in technology are sophisticated, we cannot ensure that it will always work on either your end or our end.  If the connection with a video visit is poor, we may have to switch to a telephone visit.  With either a video or telephone visit, we are not always able to ensure that we have a secure connection.   I need to obtain your verbal consent now.   Are you willing to proceed with your visit today?   Hannah Reese has provided verbal consent on 06/16/2021 for a virtual visit (video or telephone).   Hannah Finner, NP 06/16/2021  3:10 PM   Date:  06/16/2021   ID:  Hannah Reese, DOB 1961-05-25, MRN [id number]  Patient Location: Home Provider Location: Home Office   Participants: Patient and Provider for Visit and Wrap up  Method of visit: Video  Location of Patient: Home Location of Provider: Home Office Consent was obtain for visit over the video. Services rendered by provider: Visit was performed via video  A video enabled telemedicine application was used and I verified that I am speaking with the correct person using two  identifiers.  PCP:  Bennie Pierini, FNP   Chief Complaint:  sore throat  History of Present Illness:    Hannah Reese is a 60 y.o. female with history as stated below. Presents video telehealth for an acute care visit sore throat and white patch. Onset of symptoms was Friday with being tired-throat started yesterday and symptoms have been persistent. Reports nasal drainage and mild cough.  Denies having fevers, chills, shortness of breath, chest pain, ear pain.  Thinks possible exposure to covid or other sick contacts.  Modifying factors include: day and ny quil- helps some. No other aggravating or relieving factors.  No other c/o.  Past Medical, Surgical, Social History, Allergies, and Medications have been Reviewed.  Past Medical History:  Diagnosis Date   GERD (gastroesophageal reflux disease)    Recurrent cold sores     No outpatient medications have been marked as taking for the 06/16/21 encounter (Video Visit) with Hannah Finner, NP.     Allergies:   Patient has no known allergies.   ROS See HPI for history of present illness.  Physical Exam Constitutional:      Appearance: Normal appearance.  HENT:     Head: Normocephalic.     Right Ear: External ear normal.     Left Ear: External ear normal.     Nose: Nose normal.  Eyes:  Conjunctiva/sclera: Conjunctivae normal.  Pulmonary:     Effort: Pulmonary effort is normal.     Comments: No shortness of breath in conversation  No cough during visit Musculoskeletal:        General: Normal range of motion.     Cervical back: Normal range of motion.  Skin:    Coloration: Skin is not jaundiced.  Neurological:     General: No focal deficit present.     Mental Status: She is alert.  Psychiatric:        Mood and Affect: Mood normal.        Thought Content: Thought content normal.        Judgment: Judgment normal.              A&P  1. Sore throat Worse symptom of covid at this time Salt water  gargles and other OTC measures reviewed  Patient acknowledged agreement and understanding of the plan.    2. COVID-19 HT + Decline antivirals at this time. OTC measures reviewed Patient acknowledged agreement and understanding of the plan.   I discussed the assessment and treatment plan with the patient. The patient was provided an opportunity to ask questions and all were answered. The patient agreed with the plan and demonstrated an understanding of the instructions.   The patient was advised to call back or seek an in-person evaluation if the symptoms worsen or if the condition fails to improve as anticipated.   The above assessment and management plan was discussed with the patient. The patient verbalized understanding of and has agreed to the management plan. Patient is aware to call the clinic if symptoms persist or worsen. Patient is aware when to return to the clinic for a follow-up visit. Patient educated on when it is appropriate to go to the emergency department.    Time:   Today, I have spent 15 minutes with the patient with telehealth technology discussing the above problems, reviewing the chart, previous notes, medications and orders.   Medication Changes: No orders of the defined types were placed in this encounter.    Disposition:  Follow up w PCP as needed Signed, Hannah Finner, NP  06/16/2021 3:10 PM

## 2021-07-25 ENCOUNTER — Other Ambulatory Visit: Payer: Self-pay | Admitting: Nurse Practitioner

## 2021-07-25 DIAGNOSIS — Z1231 Encounter for screening mammogram for malignant neoplasm of breast: Secondary | ICD-10-CM

## 2021-08-06 DIAGNOSIS — Z1231 Encounter for screening mammogram for malignant neoplasm of breast: Secondary | ICD-10-CM

## 2021-09-01 ENCOUNTER — Ambulatory Visit
Admission: RE | Admit: 2021-09-01 | Discharge: 2021-09-01 | Disposition: A | Discharge status: Home / Self Care | Payer: No Typology Code available for payment source | Source: Ambulatory Visit | Attending: Nurse Practitioner | Admitting: Nurse Practitioner

## 2021-09-01 ENCOUNTER — Other Ambulatory Visit: Payer: Self-pay

## 2021-09-01 DIAGNOSIS — Z1231 Encounter for screening mammogram for malignant neoplasm of breast: Secondary | ICD-10-CM

## 2021-09-11 ENCOUNTER — Ambulatory Visit: Payer: No Typology Code available for payment source | Admitting: Nurse Practitioner

## 2021-09-11 ENCOUNTER — Other Ambulatory Visit: Payer: Self-pay

## 2021-09-11 ENCOUNTER — Encounter: Payer: Self-pay | Admitting: Nurse Practitioner

## 2021-09-11 VITALS — BP 112/75 | HR 80 | Temp 97.7°F | Resp 20 | Ht 63.0 in | Wt 166.0 lb

## 2021-09-11 DIAGNOSIS — B009 Herpesviral infection, unspecified: Secondary | ICD-10-CM | POA: Diagnosis not present

## 2021-09-11 DIAGNOSIS — Z6829 Body mass index (BMI) 29.0-29.9, adult: Secondary | ICD-10-CM | POA: Diagnosis not present

## 2021-09-11 DIAGNOSIS — E8881 Metabolic syndrome: Secondary | ICD-10-CM

## 2021-09-11 DIAGNOSIS — K219 Gastro-esophageal reflux disease without esophagitis: Secondary | ICD-10-CM

## 2021-09-11 DIAGNOSIS — Z23 Encounter for immunization: Secondary | ICD-10-CM

## 2021-09-11 MED ORDER — OZEMPIC (1 MG/DOSE) 2 MG/1.5ML ~~LOC~~ SOPN: 1.0000 mg | PEN_INJECTOR | SUBCUTANEOUS | 5 refills | Status: DC

## 2021-09-11 MED ORDER — OMEPRAZOLE 40 MG PO CPDR: 40.0000 mg | DELAYED_RELEASE_CAPSULE | Freq: Every day | ORAL | 1 refills | Status: DC

## 2021-09-11 MED ORDER — VALACYCLOVIR HCL 1 G PO TABS: 1000.0000 mg | ORAL_TABLET | Freq: Every day | ORAL | 1 refills | Status: DC

## 2021-09-11 NOTE — Progress Notes (Signed)
Subjective:    Patient ID: Hannah Reese, female    DOB: 1961-03-14, 60 y.o.   MRN: [id number]   Chief Complaint: Medical Management of Chronic Issues    HPI:  1. Gastroesophageal reflux disease without esophagitis Is on omeprazole and is doing well.  2. BMI 32.0-32.9,adult Weight is down 15lbs Wt Readings from Last 3 Encounters:  09/11/21 166 lb (75.3 kg)  01/16/21 181 lb 9.6 oz (82.4 kg)  08/26/20 179 lb (81.2 kg)   BMI Readings from Last 3 Encounters:  09/11/21 29.41 kg/m  01/16/21 32.17 kg/m  08/26/20 31.71 kg/m     3. 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   cyclobenzaprine (FLEXERIL) 5 MG tablet Take 1 tablet (5 mg total) by mouth 3 (three) times daily as needed for muscle spasms.   ibuprofen (ADVIL,MOTRIN) 800 MG tablet Take 1 tablet (800 mg total) by mouth every 8 (eight) hours as needed for pain.   Loratadine 10 MG CAPS    Multiple Vitamins-Minerals (ICAPS AREDS 2 PO)    omeprazole (PRILOSEC) 40 MG capsule Take 1 capsule (40 mg total) by mouth daily.   Semaglutide, 1 MG/DOSE, (OZEMPIC, 1 MG/DOSE,) 2 MG/1.5ML SOPN Inject 1 mg into the skin once a week.   valACYclovir (VALTREX) 1000 MG tablet Take 1 tablet (1,000 mg total) by mouth daily.   No facility-administered encounter medications on file as of 09/11/2021.    Past Surgical History:  Procedure Laterality Date   ABLATION  2012   AUGMENTATION MAMMAPLASTY Bilateral    BLADDER SUSPENSION  06/12/2011   Procedure: TRANSVAGINAL TAPE (TVT) PROCEDURE;  Surgeon: Sharene Butters;  Location: [facility];  Service: Gynecology;  Laterality: N/A;  Transobturator   BREAST SURGERY     TUBAL LIGATION      Family History  Problem Relation Age of Onset   Alzheimer's disease  Mother    Hypertension Father    Heart disease Father    Cancer Father    Healthy Sister    Early death Brother    Healthy Sister    Colon cancer Neg Hx    Breast cancer Neg Hx     New complaints: None today  Social history: Lives with boyfriend  Controlled substance contract: n/a     Review of Systems  Constitutional:  Negative for diaphoresis.  Eyes:  Negative for pain.  Respiratory:  Negative for shortness of breath.   Cardiovascular:  Negative for chest pain, palpitations and leg swelling.  Gastrointestinal:  Negative for abdominal pain.  Endocrine: Negative for polydipsia.  Skin:  Negative for rash.  Neurological:  Negative for dizziness, weakness and headaches.  Hematological:  Does not bruise/bleed easily.  All other systems reviewed and are negative.     Objective:   Physical Exam Vitals and nursing note reviewed.  Constitutional:      General: She is not in acute distress.    Appearance: Normal appearance. She is well-developed.  HENT:     Head: Normocephalic.     Right Ear: Tympanic membrane normal.     Left Ear: Tympanic membrane normal.     Nose: Nose normal.     Mouth/Throat:     Mouth: Mucous membranes are moist.  Eyes:     Pupils:  Pupils are equal, round, and reactive to light.  Neck:     Vascular: No carotid bruit or JVD.  Cardiovascular:     Rate and Rhythm: Normal rate and regular rhythm.     Heart sounds: Normal heart sounds.  Pulmonary:     Effort: Pulmonary effort is normal. No respiratory distress.     Breath sounds: Normal breath sounds. No wheezing or rales.  Chest:     Chest wall: No tenderness.  Abdominal:     General: Bowel sounds are normal. There is no distension or abdominal bruit.     Palpations: Abdomen is soft. There is no hepatomegaly, splenomegaly, mass or pulsatile mass.     Tenderness: There is no abdominal tenderness.  Musculoskeletal:        General: Normal range of motion.     Cervical back: Normal range of  motion and neck supple.  Lymphadenopathy:     Cervical: No cervical adenopathy.  Skin:    General: Skin is warm and dry.  Neurological:     Mental Status: She is alert and oriented to person, place, and time.     Deep Tendon Reflexes: Reflexes are normal and symmetric.  Psychiatric:        Behavior: Behavior normal.        Thought Content: Thought content normal.        Judgment: Judgment normal.    BP 112/75   Pulse 80   Temp 97.7 F (36.5 C) (Temporal)   Resp 20   Ht 5' 3" (1.6 m)   Wt 166 lb (75.3 kg)   BMI 29.41 kg/m       Assessment & Plan:  Hannah Reese comes in today with chief complaint of Medical Management of Chronic Issues   Diagnosis and orders addressed:  1. Gastroesophageal reflux disease without esophagitis Avoid spicy foods Do not eat 2 hours prior to bedtime - omeprazole (PRILOSEC) 40 MG capsule; Take 1 capsule (40 mg total) by mouth daily.  Dispense: 90 capsule; Refill: 1  2. BMI 29.0-29.9,adult Discussed diet and exercise for person with BMI >25 Will recheck weight in 3-6 months   3. HSV-1 (herpes simplex virus 1) infection - valACYclovir (VALTREX) 1000 MG tablet; Take 1 tablet (1,000 mg total) by mouth daily.  Dispense: 90 tablet; Refill: 1  4. Metabolic syndrome - Semaglutide, 1 MG/DOSE, (OZEMPIC, 1 MG/DOSE,) 2 MG/1.5ML SOPN; Inject 1 mg into the skin once a week.  Dispense: 6 mL; Refill: 5 - CBC with Differential/Platelet - CMP14+EGFR   Labs pending Health Maintenance reviewed Diet and exercise encouraged  Follow up plan: 6 months   [place], FNP

## 2021-09-11 NOTE — Patient Instructions (Signed)

## 2021-09-12 LAB — CMP14+EGFR
ALT: 8 IU/L (ref 0–32)
AST: 14 IU/L (ref 0–40)
Albumin/Globulin Ratio: 2.4 — ABNORMAL HIGH (ref 1.2–2.2)
Albumin: 4.8 g/dL (ref 3.8–4.9)
Alkaline Phosphatase: 91 IU/L (ref 44–121)
BUN/Creatinine Ratio: 18 (ref 9–23)
BUN: 16 mg/dL (ref 6–24)
Bilirubin Total: 0.2 mg/dL (ref 0.0–1.2)
CO2: 26 mmol/L (ref 20–29)
Calcium: 10 mg/dL (ref 8.7–10.2)
Chloride: 99 mmol/L (ref 96–106)
Creatinine, Ser: 0.87 mg/dL (ref 0.57–1.00)
Globulin, Total: 2 g/dL (ref 1.5–4.5)
Glucose: 86 mg/dL (ref 70–99)
Potassium: 4.3 mmol/L (ref 3.5–5.2)
Sodium: 139 mmol/L (ref 134–144)
Total Protein: 6.8 g/dL (ref 6.0–8.5)
eGFR: 78 mL/min/{1.73_m2} (ref >59)

## 2021-09-12 LAB — CBC WITH DIFFERENTIAL/PLATELET
Basophils Absolute: 0.1 10*3/uL (ref 0.0–0.2)
Basos: 1 %
EOS (ABSOLUTE): 0.1 10*3/uL (ref 0.0–0.4)
Eos: 1 %
Hematocrit: 41.1 % (ref 34.0–46.6)
Hemoglobin: 13.3 g/dL (ref 11.1–15.9)
Immature Grans (Abs): 0 10*3/uL (ref 0.0–0.1)
Immature Granulocytes: 0 %
Lymphocytes Absolute: 2.7 10*3/uL (ref 0.7–3.1)
Lymphs: 41 %
MCH: 30 pg (ref 26.6–33.0)
MCHC: 32.4 g/dL (ref 31.5–35.7)
MCV: 93 fL (ref 79–97)
Monocytes Absolute: 0.4 10*3/uL (ref 0.1–0.9)
Monocytes: 6 %
Neutrophils Absolute: 3.3 10*3/uL (ref 1.4–7.0)
Neutrophils: 51 %
Platelets: 280 10*3/uL (ref 150–450)
RBC: 4.44 x10E6/uL (ref 3.77–5.28)
RDW: 12.9 % (ref 11.7–15.4)
WBC: 6.5 10*3/uL (ref 3.4–10.8)

## 2022-01-20 ENCOUNTER — Other Ambulatory Visit: Payer: Self-pay | Admitting: Nurse Practitioner

## 2022-01-20 DIAGNOSIS — M545 Low back pain, unspecified: Secondary | ICD-10-CM

## 2022-03-16 ENCOUNTER — Encounter: Payer: Self-pay | Admitting: Nurse Practitioner

## 2022-03-16 ENCOUNTER — Ambulatory Visit (INDEPENDENT_AMBULATORY_CARE_PROVIDER_SITE_OTHER): Payer: No Typology Code available for payment source | Admitting: Nurse Practitioner

## 2022-03-16 ENCOUNTER — Telehealth: Payer: Self-pay | Admitting: *Deleted

## 2022-03-16 VITALS — BP 112/74 | HR 60 | Temp 97.7°F | Resp 20 | Ht 63.0 in | Wt 161.0 lb

## 2022-03-16 DIAGNOSIS — B009 Herpesviral infection, unspecified: Secondary | ICD-10-CM

## 2022-03-16 DIAGNOSIS — K219 Gastro-esophageal reflux disease without esophagitis: Secondary | ICD-10-CM

## 2022-03-16 DIAGNOSIS — Z6828 Body mass index (BMI) 28.0-28.9, adult: Secondary | ICD-10-CM

## 2022-03-16 MED ORDER — OZEMPIC (2 MG/DOSE) 8 MG/3ML ~~LOC~~ SOPN: PEN_INJECTOR | SUBCUTANEOUS | 3 refills | Status: DC

## 2022-03-16 MED ORDER — OMEPRAZOLE 40 MG PO CPDR: 40.0000 mg | DELAYED_RELEASE_CAPSULE | Freq: Every day | ORAL | 1 refills | Status: DC

## 2022-03-16 MED ORDER — VALACYCLOVIR HCL 1 G PO TABS: 1000.0000 mg | ORAL_TABLET | Freq: Every day | ORAL | 1 refills | Status: DC

## 2022-03-16 MED ORDER — WEGOVY 1 MG/0.5ML ~~LOC~~ SOAJ: 1.0000 mg | SUBCUTANEOUS | 2 refills | Status: DC

## 2022-03-16 NOTE — Patient Instructions (Signed)

## 2022-03-16 NOTE — Telephone Encounter (Signed)
Patient notified

## 2022-03-16 NOTE — Telephone Encounter (Signed)
Ozempic prior authorization DENIED.  ? ?Criteria for approval requires a diagnosis of diabetes mellitus. ? ? ? ?

## 2022-03-16 NOTE — Telephone Encounter (Signed)
Please ler patient know they deneid ozempic because she is not a diabetic- will try and see if they willpay for wegovy- same meds just at different doses. I will let yoyu  know if prior auth goes throuh. ?

## 2022-03-16 NOTE — Progress Notes (Signed)
? ?Subjective:  ? ? Patient ID: Hannah Reese, female    DOB: 08/22/1961, 61 y.o.   MRN: [id number] ? ? ?Chief Complaint: Medical Management of Chronic Issues ?  ? ?HPI: ? ?Hannah Reese is a 61 y.o. who identifies as a female who was assigned female at birth.  ? ?Social history: ?Lives with: boyfriend ?Work history: [employer] ? ? ?Comes in today for follow up of the following chronic medical issues: ? ?1. Gastroesophageal reflux disease without esophagitis ?Is on omperazole and os doing well. ? ?2. HSV-1 (herpes simplex virus 1) infection ?No recent flare ups. Is on valtrex daily ? ?3. BMI 29.0-29.9,adult ?Is on ozmepic and is doing well. Weight  is down 5lbs form last visit ?Wt Readings from Last 3 Encounters:  ?03/16/22 161 lb (73 kg)  ?09/11/21 166 lb (75.3 kg)  ?01/16/21 181 lb 9.6 oz (82.4 kg)  ? ?BMI Readings from Last 3 Encounters:  ?03/16/22 28.52 kg/m?  ?09/11/21 29.41 kg/m?  ?01/16/21 32.17 kg/m?  ? ? ? ? ?New complaints: ?None today ? ?No Known Allergies ?Outpatient Encounter Medications as of 03/16/2022  ?Medication Sig  ? B Complex Vitamins (B-COMPLEX/B-12) TABS   ? Cholecalciferol (VITAMIN D) 1000 UNITS capsule Take 1,000 Units by mouth daily.  ? cyclobenzaprine (FLEXERIL) 5 MG tablet Take 1 tablet (5 mg total) by mouth 3 (three) times daily as needed for muscle spasms.  ? ibuprofen (ADVIL,MOTRIN) 800 MG tablet Take 1 tablet (800 mg total) by mouth every 8 (eight) hours as needed for pain.  ? Loratadine 10 MG CAPS   ? Multiple Vitamins-Minerals (ICAPS AREDS 2 PO)   ? omeprazole (PRILOSEC) 40 MG capsule Take 1 capsule (40 mg total) by mouth daily.  ? Semaglutide, 1 MG/DOSE, (OZEMPIC, 1 MG/DOSE,) 2 MG/1.5ML SOPN Inject 1 mg into the skin once a week.  ? valACYclovir (VALTREX) 1000 MG tablet Take 1 tablet (1,000 mg total) by mouth daily.  ? ?No facility-administered encounter medications on file as of 03/16/2022.  ? ? ?Past Surgical History:  ?Procedure Laterality Date  ? ABLATION  2012  ? AUGMENTATION  MAMMAPLASTY Bilateral   ? BLADDER SUSPENSION  06/12/2011  ? Procedure: TRANSVAGINAL TAPE (TVT) PROCEDURE;  Surgeon: Sharene Butters;  Location: [facility];  Service: Gynecology;  Laterality: N/A;  Transobturator  ? BREAST SURGERY    ? TUBAL LIGATION    ? ? ?Family History  ?Problem Relation Age of Onset  ? Alzheimer's disease Mother   ? Hypertension Father   ? Heart disease Father   ? Cancer Father   ? Healthy Sister   ? Early death Brother   ? Healthy Sister   ? Colon cancer Neg Hx   ? Breast cancer Neg Hx   ? ? ? ? ?Controlled substance contract: n/a ? ? ? ? ?Review of Systems  ?Constitutional:  Negative for diaphoresis.  ?Eyes:  Negative for pain.  ?Respiratory:  Negative for shortness of breath.   ?Cardiovascular:  Negative for chest pain, palpitations and leg swelling.  ?Gastrointestinal:  Negative for abdominal pain.  ?Endocrine: Negative for polydipsia.  ?Skin:  Negative for rash.  ?Neurological:  Negative for dizziness, weakness and headaches.  ?Hematological:  Does not bruise/bleed easily.  ?All other systems reviewed and are negative. ? ?   ?Objective:  ? Physical Exam ?Vitals and nursing note reviewed.  ?Constitutional:   ?   General: She is not in acute distress. ?   Appearance: Normal appearance. She is well-developed.  ?HENT:  ?  Head: Normocephalic.  ?   Right Ear: Tympanic membrane normal.  ?   Left Ear: Tympanic membrane normal.  ?   Nose: Nose normal.  ?   Mouth/Throat:  ?   Mouth: Mucous membranes are moist.  ?Eyes:  ?   Pupils: Pupils are equal, round, and reactive to light.  ?Neck:  ?   Vascular: No carotid bruit or JVD.  ?Cardiovascular:  ?   Rate and Rhythm: Normal rate and regular rhythm.  ?   Heart sounds: Normal heart sounds.  ?Pulmonary:  ?   Effort: Pulmonary effort is normal. No respiratory distress.  ?   Breath sounds: Normal breath sounds. No wheezing or rales.  ?Chest:  ?   Chest wall: No tenderness.  ?Abdominal:  ?   General: Bowel sounds are normal. There is no distension or abdominal  bruit.  ?   Palpations: Abdomen is soft. There is no hepatomegaly, splenomegaly, mass or pulsatile mass.  ?   Tenderness: There is no abdominal tenderness.  ?Musculoskeletal:     ?   General: Normal range of motion.  ?   Cervical back: Normal range of motion and neck supple.  ?Lymphadenopathy:  ?   Cervical: No cervical adenopathy.  ?Skin: ?   General: Skin is warm and dry.  ?Neurological:  ?   Mental Status: She is alert and oriented to person, place, and time.  ?   Deep Tendon Reflexes: Reflexes are normal and symmetric.  ?Psychiatric:     ?   Behavior: Behavior normal.     ?   Thought Content: Thought content normal.     ?   Judgment: Judgment normal.  ? ? ?BP 112/74   Pulse 60   Temp 97.7 ?F (36.5 ?C) (Temporal)   Resp 20   Ht '5\' 3"'  (1.6 m)   Wt 161 lb (73 kg)   SpO2 100%   BMI 28.52 kg/m?  ? ? ? ?   ?Assessment & Plan:  ?Hannah Reese comes in today with chief complaint of Medical Management of Chronic Issues ? ? ?Diagnosis and orders addressed: ? ?1. Gastroesophageal reflux disease without esophagitis ?Avoid spicy foods ?Do not eat 2 hours prior to bedtime ?- omeprazole (PRILOSEC) 40 MG capsule; Take 1 capsule (40 mg total) by mouth daily.  Dispense: 90 capsule; Refill: 1 ?- CBC with Differential/Platelet ?- CMP14+EGFR ?- Lipid panel ? ?2. HSV-1 (herpes simplex virus 1) infection ?- valACYclovir (VALTREX) 1000 MG tablet; Take 1 tablet (1,000 mg total) by mouth daily.  Dispense: 90 tablet; Refill: 1 ? ?3. BMI 28.0-28.9,adult ?Discussed diet and exercise for person with BMI >25 ?Will recheck weight in 3-6 months ? ?- Semaglutide, 2 MG/DOSE, (OZEMPIC, 2 MG/DOSE,) 8 MG/3ML SOPN; I shot weekly  Dispense: 3 mL; Refill: 3 ? ? ?Labs pending ?Health Maintenance reviewed ?Diet and exercise encouraged ? ?Follow up plan: ?6 months ? ? ?Mary-Margaret Hassell Done, FNP ? ? ?

## 2022-03-16 NOTE — Telephone Encounter (Signed)
Hannah Reese ?  ?(Key: [id number]) ? ?Rx #: [id number] ? ?Ozempic (2 MG/DOSE) 8MG /3ML pen-injectors ? ?Sent to plan ?

## 2022-03-17 ENCOUNTER — Telehealth: Payer: Self-pay | Admitting: *Deleted

## 2022-03-17 LAB — CMP14+EGFR
ALT: 7 IU/L (ref 0–32)
AST: 17 IU/L (ref 0–40)
Albumin/Globulin Ratio: 2.1 (ref 1.2–2.2)
Albumin: 4.8 g/dL (ref 3.8–4.9)
Alkaline Phosphatase: 91 IU/L (ref 44–121)
BUN/Creatinine Ratio: 23 (ref 9–23)
BUN: 19 mg/dL (ref 6–24)
Bilirubin Total: 0.2 mg/dL (ref 0.0–1.2)
CO2: 24 mmol/L (ref 20–29)
Calcium: 9.7 mg/dL (ref 8.7–10.2)
Chloride: 103 mmol/L (ref 96–106)
Creatinine, Ser: 0.82 mg/dL (ref 0.57–1.00)
Globulin, Total: 2.3 g/dL (ref 1.5–4.5)
Glucose: 88 mg/dL (ref 70–99)
Potassium: 5 mmol/L (ref 3.5–5.2)
Sodium: 139 mmol/L (ref 134–144)
Total Protein: 7.1 g/dL (ref 6.0–8.5)
eGFR: 83 mL/min/{1.73_m2} (ref >59)

## 2022-03-17 LAB — CBC WITH DIFFERENTIAL/PLATELET
Basophils Absolute: 0.1 10*3/uL (ref 0.0–0.2)
Basos: 1 %
EOS (ABSOLUTE): 0.1 10*3/uL (ref 0.0–0.4)
Eos: 1 %
Hematocrit: 39.4 % (ref 34.0–46.6)
Hemoglobin: 13.4 g/dL (ref 11.1–15.9)
Immature Grans (Abs): 0 10*3/uL (ref 0.0–0.1)
Immature Granulocytes: 0 %
Lymphocytes Absolute: 2.5 10*3/uL (ref 0.7–3.1)
Lymphs: 45 %
MCH: 30.5 pg (ref 26.6–33.0)
MCHC: 34 g/dL (ref 31.5–35.7)
MCV: 90 fL (ref 79–97)
Monocytes Absolute: 0.3 10*3/uL (ref 0.1–0.9)
Monocytes: 5 %
Neutrophils Absolute: 2.6 10*3/uL (ref 1.4–7.0)
Neutrophils: 48 %
Platelets: 240 10*3/uL (ref 150–450)
RBC: 4.4 x10E6/uL (ref 3.77–5.28)
RDW: 12.9 % (ref 11.7–15.4)
WBC: 5.5 10*3/uL (ref 3.4–10.8)

## 2022-03-17 LAB — LIPID PANEL
Chol/HDL Ratio: 3.6 ratio (ref 0.0–4.4)
Cholesterol, Total: 231 mg/dL — ABNORMAL HIGH (ref 100–199)
HDL: 65 mg/dL (ref >39)
LDL Chol Calc (NIH): 145 mg/dL — ABNORMAL HIGH (ref 0–99)
Triglycerides: 118 mg/dL (ref 0–149)
VLDL Cholesterol Cal: 21 mg/dL (ref 5–40)

## 2022-03-17 NOTE — Telephone Encounter (Signed)
Ozempic previously DENIED ? ?[place]egovy Denied ? ?Saxenda? ?

## 2022-03-17 NOTE — Telephone Encounter (Signed)
Prior authorization for [facility] DENIED ? ?Excluded from the plan ? ? ? ?

## 2022-03-17 NOTE — Telephone Encounter (Signed)
Your insurance will no longer pay for wegovy or ozempic. sorry ?

## 2022-03-17 NOTE — Telephone Encounter (Signed)
Patient aware.

## 2022-03-17 NOTE — Telephone Encounter (Signed)
Hannah Reese (Key: [id number]) ?Rx #: [id number] ?[id number] 1MG /0.5ML auto-injector ? ?Sent to plan ? ?

## 2022-08-18 ENCOUNTER — Other Ambulatory Visit: Payer: Self-pay | Admitting: Nurse Practitioner

## 2022-08-18 DIAGNOSIS — Z1231 Encounter for screening mammogram for malignant neoplasm of breast: Secondary | ICD-10-CM

## 2022-09-18 ENCOUNTER — Ambulatory Visit (INDEPENDENT_AMBULATORY_CARE_PROVIDER_SITE_OTHER): Payer: No Typology Code available for payment source | Admitting: Nurse Practitioner

## 2022-09-18 ENCOUNTER — Encounter: Payer: Self-pay | Admitting: Nurse Practitioner

## 2022-09-18 VITALS — BP 111/67 | HR 80 | Temp 97.5°F | Resp 20 | Ht 63.0 in | Wt 180.0 lb

## 2022-09-18 DIAGNOSIS — B009 Herpesviral infection, unspecified: Secondary | ICD-10-CM | POA: Diagnosis not present

## 2022-09-18 DIAGNOSIS — Z6829 Body mass index (BMI) 29.0-29.9, adult: Secondary | ICD-10-CM

## 2022-09-18 DIAGNOSIS — Z23 Encounter for immunization: Secondary | ICD-10-CM | POA: Diagnosis not present

## 2022-09-18 DIAGNOSIS — K219 Gastro-esophageal reflux disease without esophagitis: Secondary | ICD-10-CM | POA: Diagnosis not present

## 2022-09-18 MED ORDER — VALACYCLOVIR HCL 1 G PO TABS: 1000.0000 mg | ORAL_TABLET | Freq: Every day | ORAL | 1 refills | Status: DC

## 2022-09-18 MED ORDER — OMEPRAZOLE 40 MG PO CPDR: 40.0000 mg | DELAYED_RELEASE_CAPSULE | Freq: Every day | ORAL | 1 refills | Status: DC

## 2022-09-18 NOTE — Patient Instructions (Signed)
Exercising to Stay Healthy To become healthy and stay healthy, it is recommended that you do moderate-intensity and vigorous-intensity exercise. You can tell that you are exercising at a moderate intensity if your heart starts beating faster and you start breathing faster but can still hold a conversation. You can tell that you are exercising at a vigorous intensity if you are breathing much harder and faster and cannot hold a conversation while exercising. How can exercise benefit me? Exercising regularly is important. It has many health benefits, such as: Improving overall fitness, flexibility, and endurance. Increasing bone density. Helping with weight control. Decreasing body fat. Increasing muscle strength and endurance. Reducing stress and tension, anxiety, depression, or anger. Improving overall health. What guidelines should I follow while exercising? Before you start a new exercise program, talk with your health care provider. Do not exercise so much that you hurt yourself, feel dizzy, or get very short of breath. Wear comfortable clothes and wear shoes with good support. Drink plenty of water while you exercise to prevent dehydration or heat stroke. Work out until your breathing and your heartbeat get faster (moderate intensity). How often should I exercise? Choose an activity that you enjoy, and set realistic goals. Your health care provider can help you make an activity plan that is individually designed and works best for you. Exercise regularly as told by your health care provider. This may include: Doing strength training two times a week, such as: Lifting weights. Using resistance bands. Push-ups. Sit-ups. Yoga. Doing a certain intensity of exercise for a given amount of time. Choose from these options: A total of 150 minutes of moderate-intensity exercise every week. A total of 75 minutes of vigorous-intensity exercise every week. A mix of moderate-intensity and  vigorous-intensity exercise every week. Children, pregnant women, people who have not exercised regularly, people who are overweight, and older adults may need to talk with a health care provider about what activities are safe to perform. If you have a medical condition, be sure to talk with your health care provider before you start a new exercise program. What are some exercise ideas? Moderate-intensity exercise ideas include: Walking 1 mile (1.6 km) in about 15 minutes. Biking. Hiking. Golfing. Dancing. Water aerobics. Vigorous-intensity exercise ideas include: Walking 4.5 miles (7.2 km) or more in about 1 hour. Jogging or running 5 miles (8 km) in about 1 hour. Biking 10 miles (16.1 km) or more in about 1 hour. Lap swimming. Roller-skating or in-line skating. Cross-country skiing. Vigorous competitive sports, such as football, basketball, and soccer. Jumping rope. Aerobic dancing. What are some everyday activities that can help me get exercise? Yard work, such as: Pushing a lawn mower. Raking and bagging leaves. Washing your car. Pushing a stroller. Shoveling snow. Gardening. Washing windows or floors. How can I be more active in my day-to-day activities? Use stairs instead of an elevator. Take a walk during your lunch break. If you drive, park your car farther away from your work or school. If you take public transportation, get off one stop early and walk the rest of the way. Stand up or walk around during all of your indoor phone calls. Get up, stretch, and walk around every 30 minutes throughout the day. Enjoy exercise with a friend. Support to continue exercising will help you keep a regular routine of activity. Where to find more information You can find more information about exercising to stay healthy from: U.[place]. Department of Health and Human Services: www.hhs.gov Centers for Disease Control and Prevention (  CDC): www.cdc.gov Summary Exercising regularly is  important. It will improve your overall fitness, flexibility, and endurance. Regular exercise will also improve your overall health. It can help you control your weight, reduce stress, and improve your bone density. Do not exercise so much that you hurt yourself, feel dizzy, or get very short of breath. Before you start a new exercise program, talk with your health care provider. This information is not intended to replace advice given to you by your health care provider. Make sure you discuss any questions you have with your health care provider. Document Revised: 02/14/2021 Document Reviewed: 02/14/2021 Elsevier Patient Education  2023 [employer]  

## 2022-09-18 NOTE — Progress Notes (Signed)
Subjective:    Patient ID: Hannah Reese, female    DOB: 1961-07-10, 61 y.o.   MRN: [id number]  Chief Complaint: No orders of the defined types were placed in this encounter.     HPI:  Hannah Reese is a 61 y.o. who identifies as a female who was assigned female at birth.   Social history: Lives with: boyfriend Work history: works at [employer] in today for follow up of the following chronic medical issues:  1. Gastroesophageal reflux disease without esophagitis Is on omeprazole daily and is doing well.  2. HSV-1 (herpes simplex virus 1) infection Has had no recent outbreaks. Takes valtrex daily  3. BMI 29.0-29.9,adult We tried to put her on ozempic but insurance would not approve it. Weight is up 19 lbs Wt Readings from Last 3 Encounters:  09/18/22 180 lb (81.6 kg)  03/16/22 161 lb (73 kg)  09/11/21 166 lb (75.3 kg)   BMI Readings from Last 3 Encounters:  09/18/22 31.89 kg/m  03/16/22 28.52 kg/m  09/11/21 29.41 kg/m     New complaints: None today  No Known Allergies Outpatient Encounter Medications as of 09/18/2022  Medication Sig   B Complex Vitamins (B-COMPLEX/B-12) TABS    Cholecalciferol (VITAMIN D) 1000 UNITS capsule Take 1,000 Units by mouth daily.   cyclobenzaprine (FLEXERIL) 5 MG tablet Take 1 tablet (5 mg total) by mouth 3 (three) times daily as needed for muscle spasms.   ibuprofen (ADVIL,MOTRIN) 800 MG tablet Take 1 tablet (800 mg total) by mouth every 8 (eight) hours as needed for pain.   Loratadine 10 MG CAPS    Multiple Vitamins-Minerals (ICAPS AREDS 2 PO)    omeprazole (PRILOSEC) 40 MG capsule Take 1 capsule (40 mg total) by mouth daily.   Semaglutide, 2 MG/DOSE, (OZEMPIC, 2 MG/DOSE,) 8 MG/3ML SOPN I shot weekly   Semaglutide-Weight Management (WEGOVY) 1 MG/0.5ML SOAJ Inject 1 mg into the skin once a week.   valACYclovir (VALTREX) 1000 MG tablet Take 1 tablet (1,000 mg total) by mouth daily.   No facility-administered encounter  medications on file as of 09/18/2022.    Past Surgical History:  Procedure Laterality Date   ABLATION  2012   AUGMENTATION MAMMAPLASTY Bilateral    BLADDER SUSPENSION  06/12/2011   Procedure: TRANSVAGINAL TAPE (TVT) PROCEDURE;  Surgeon: Mickel Baas;  Location: [facility];  Service: Gynecology;  Laterality: N/A;  Transobturator   BREAST SURGERY     TUBAL LIGATION      Family History  Problem Relation Age of Onset   Alzheimer's disease Mother    Hypertension Father    Heart disease Father    Cancer Father    Healthy Sister    Early death Brother    Healthy Sister    Colon cancer Neg Hx    Breast cancer Neg Hx       Controlled substance contract: n/a     Review of Systems  Constitutional:  Negative for diaphoresis.  Eyes:  Negative for pain.  Respiratory:  Negative for shortness of breath.   Cardiovascular:  Negative for chest pain, palpitations and leg swelling.  Gastrointestinal:  Negative for abdominal pain.  Endocrine: Negative for polydipsia.  Skin:  Negative for rash.  Neurological:  Negative for dizziness, weakness and headaches.  Hematological:  Does not bruise/bleed easily.  All other systems reviewed and are negative.      Objective:   Physical Exam Vitals and nursing note reviewed.  Constitutional:  General: She is not in acute distress.    Appearance: Normal appearance. She is well-developed.  HENT:     Head: Normocephalic.     Right Ear: Tympanic membrane normal.     Left Ear: Tympanic membrane normal.     Nose: Nose normal.     Mouth/Throat:     Mouth: Mucous membranes are moist.  Eyes:     Pupils: Pupils are equal, round, and reactive to light.  Neck:     Vascular: No carotid bruit or JVD.  Cardiovascular:     Rate and Rhythm: Normal rate and regular rhythm.     Heart sounds: Normal heart sounds.  Pulmonary:     Effort: Pulmonary effort is normal. No respiratory distress.     Breath sounds: Normal breath sounds. No wheezing or  rales.  Chest:     Chest wall: No tenderness.  Abdominal:     General: Bowel sounds are normal. There is no distension or abdominal bruit.     Palpations: Abdomen is soft. There is no hepatomegaly, splenomegaly, mass or pulsatile mass.     Tenderness: There is no abdominal tenderness.  Musculoskeletal:        General: Normal range of motion.     Cervical back: Normal range of motion and neck supple.  Lymphadenopathy:     Cervical: No cervical adenopathy.  Skin:    General: Skin is warm and dry.  Neurological:     Mental Status: She is alert and oriented to person, place, and time.     Deep Tendon Reflexes: Reflexes are normal and symmetric.  Psychiatric:        Behavior: Behavior normal.        Thought Content: Thought content normal.        Judgment: Judgment normal.    BP 111/67   Pulse 80   Temp (!) 97.5 F (36.4 C) (Temporal)   Resp 20   Ht 5\' 3"  (1.6 m)   Wt 180 lb (81.6 kg)   SpO2 98%   BMI 31.89 kg/m         Assessment & Plan:  Hannah Reese comes in today with chief complaint of Medical Management of Chronic Issues   Diagnosis and orders addressed:  1. Gastroesophageal reflux disease without esophagitis Avoid spicy foods Do not eat 2 hours prior to bedtime  - omeprazole (PRILOSEC) 40 MG capsule; Take 1 capsule (40 mg total) by mouth daily.  Dispense: 90 capsule; Refill: 1  2. HSV-1 (herpes simplex virus 1) infection - valACYclovir (VALTREX) 1000 MG tablet; Take 1 tablet (1,000 mg total) by mouth daily.  Dispense: 90 tablet; Refill: 1  3. BMI 29.0-29.9,adult Discussed diet and exercise for person with BMI >25 Will recheck weight in 3-6 months    Labs pending Health Maintenance reviewed Diet and exercise encouraged  Follow up plan: 3 months   Hannah Reese 07-31-1984, FNP

## 2022-09-18 NOTE — Addendum Note (Signed)
Addended by: Bennie Pierini on: 09/18/2022 02:19 PM   Modules accepted: Level of Service

## 2022-10-02 DIAGNOSIS — Z1231 Encounter for screening mammogram for malignant neoplasm of breast: Secondary | ICD-10-CM

## 2022-10-15 ENCOUNTER — Ambulatory Visit
Admission: RE | Admit: 2022-10-15 | Discharge: 2022-10-15 | Disposition: A | Discharge status: Home / Self Care | Payer: No Typology Code available for payment source | Source: Ambulatory Visit | Attending: Nurse Practitioner | Admitting: Nurse Practitioner

## 2022-10-15 DIAGNOSIS — Z1231 Encounter for screening mammogram for malignant neoplasm of breast: Secondary | ICD-10-CM

## 2023-01-01 ENCOUNTER — Other Ambulatory Visit (HOSPITAL_COMMUNITY)
Admission: RE | Admit: 2023-01-01 | Discharge: 2023-01-01 | Disposition: A | Discharge status: Home / Self Care | Payer: No Typology Code available for payment source | Source: Ambulatory Visit | Attending: Nurse Practitioner | Admitting: Nurse Practitioner

## 2023-01-01 ENCOUNTER — Ambulatory Visit (INDEPENDENT_AMBULATORY_CARE_PROVIDER_SITE_OTHER): Payer: No Typology Code available for payment source

## 2023-01-01 ENCOUNTER — Encounter: Payer: Self-pay | Admitting: Nurse Practitioner

## 2023-01-01 ENCOUNTER — Ambulatory Visit: Payer: No Typology Code available for payment source | Admitting: Nurse Practitioner

## 2023-01-01 VITALS — BP 114/78 | HR 76 | Temp 97.4°F | Resp 20 | Ht 63.0 in | Wt 184.0 lb

## 2023-01-01 DIAGNOSIS — B009 Herpesviral infection, unspecified: Secondary | ICD-10-CM | POA: Diagnosis not present

## 2023-01-01 DIAGNOSIS — K219 Gastro-esophageal reflux disease without esophagitis: Secondary | ICD-10-CM

## 2023-01-01 DIAGNOSIS — Z0001 Encounter for general adult medical examination with abnormal findings: Secondary | ICD-10-CM | POA: Diagnosis not present

## 2023-01-01 DIAGNOSIS — Z6829 Body mass index (BMI) 29.0-29.9, adult: Secondary | ICD-10-CM | POA: Diagnosis not present

## 2023-01-01 DIAGNOSIS — Z0000 Encounter for general adult medical examination without abnormal findings: Secondary | ICD-10-CM | POA: Insufficient documentation

## 2023-01-01 MED ORDER — VALACYCLOVIR HCL 1 G PO TABS: 1000.0000 mg | ORAL_TABLET | Freq: Every day | ORAL | 1 refills | Status: DC

## 2023-01-01 MED ORDER — OMEPRAZOLE 40 MG PO CPDR: 40.0000 mg | DELAYED_RELEASE_CAPSULE | Freq: Every day | ORAL | 1 refills | Status: DC

## 2023-01-01 NOTE — Addendum Note (Signed)
Addended by: Chevis Pretty on: 01/01/2023 10:46 AM   Modules accepted: Orders

## 2023-01-01 NOTE — Progress Notes (Signed)
Subjective:    Patient ID: Hannah Reese, female    DOB: 1961-03-27, 62 y.o.   MRN: [id number]   Chief Complaint: annual physical with PAP    HPI:  Hannah Reese is a 62 y.o. who identifies as a female who was assigned female at birth.   Social history: Lives with: boyfriend Work history: works for [employer] in today for follow up of the following chronic medical issues:  1. Gastroesophageal reflux disease without esophagitis Is on ompeprazole daily and is doing well. No symptoms if takes meds  2. HSV-1 (herpes simplex virus 1) infection Takes valtrex daily to prevent outbreak. Has not had any recent outbreaks.  3. BMI 29.0-29.9,adult No recent weight changes Wt Readings from Last 3 Encounters:  01/01/23 184 lb (83.5 kg)  09/18/22 180 lb (81.6 kg)  03/16/22 161 lb (73 kg)   BMI Readings from Last 3 Encounters:  01/01/23 32.59 kg/m  09/18/22 31.89 kg/m  03/16/22 28.52 kg/m     New complaints: None today  No Known Allergies Outpatient Encounter Medications as of 01/01/2023  Medication Sig   B Complex Vitamins (B-COMPLEX/B-12) TABS    Cholecalciferol (VITAMIN D) 1000 UNITS capsule Take 1,000 Units by mouth daily.   cyclobenzaprine (FLEXERIL) 5 MG tablet Take 1 tablet (5 mg total) by mouth 3 (three) times daily as needed for muscle spasms.   ibuprofen (ADVIL,MOTRIN) 800 MG tablet Take 1 tablet (800 mg total) by mouth every 8 (eight) hours as needed for pain.   Loratadine 10 MG CAPS    Multiple Vitamins-Minerals (ICAPS AREDS 2 PO)    omeprazole (PRILOSEC) 40 MG capsule Take 1 capsule (40 mg total) by mouth daily.   valACYclovir (VALTREX) 1000 MG tablet Take 1 tablet (1,000 mg total) by mouth daily.   No facility-administered encounter medications on file as of 01/01/2023.    Past Surgical History:  Procedure Laterality Date   ABLATION  2012   AUGMENTATION MAMMAPLASTY Bilateral    BLADDER SUSPENSION  06/12/2011   Procedure: TRANSVAGINAL TAPE (TVT)  PROCEDURE;  Surgeon: Sharene Butters;  Location: [facility];  Service: Gynecology;  Laterality: N/A;  Transobturator   BREAST SURGERY     TUBAL LIGATION      Family History  Problem Relation Age of Onset   Alzheimer's disease Mother    Hypertension Father    Heart disease Father    Cancer Father    Healthy Sister    Early death Brother    Healthy Sister    Colon cancer Neg Hx    Breast cancer Neg Hx       Controlled substance contract: n/a     Review of Systems  Constitutional:  Negative for diaphoresis.  Eyes:  Negative for pain.  Respiratory:  Negative for shortness of breath.   Cardiovascular:  Negative for chest pain, palpitations and leg swelling.  Gastrointestinal:  Negative for abdominal pain.  Endocrine: Negative for polydipsia.  Skin:  Negative for rash.  Neurological:  Negative for dizziness, weakness and headaches.  Hematological:  Does not bruise/bleed easily.  All other systems reviewed and are negative.      Objective:   Physical Exam Vitals and nursing note reviewed.  Constitutional:      General: She is not in acute distress.    Appearance: Normal appearance. She is well-developed.  HENT:     Head: Normocephalic.     Right Ear: Tympanic membrane normal.     Left Ear: Tympanic membrane  normal.     Nose: Nose normal.     Mouth/Throat:     Mouth: Mucous membranes are moist.  Eyes:     Pupils: Pupils are equal, round, and reactive to light.  Neck:     Vascular: No carotid bruit or JVD.  Cardiovascular:     Rate and Rhythm: Normal rate and regular rhythm.     Heart sounds: Normal heart sounds.  Pulmonary:     Effort: Pulmonary effort is normal. No respiratory distress.     Breath sounds: Normal breath sounds. No wheezing or rales.  Chest:     Chest wall: No tenderness.  Abdominal:     General: Bowel sounds are normal. There is no distension or abdominal bruit.     Palpations: Abdomen is soft. There is no hepatomegaly, splenomegaly, mass or  pulsatile mass.     Tenderness: There is no abdominal tenderness.  Musculoskeletal:        General: Normal range of motion.     Cervical back: Normal range of motion and neck supple.  Lymphadenopathy:     Cervical: No cervical adenopathy.  Skin:    General: Skin is warm and dry.  Neurological:     Mental Status: She is alert and oriented to person, place, and time.     Deep Tendon Reflexes: Reflexes are normal and symmetric.  Psychiatric:        Behavior: Behavior normal.        Thought Content: Thought content normal.        Judgment: Judgment normal.    BP 114/78   Pulse 76   Temp (!) 97.4 F (36.3 C) (Temporal)   Resp 20   Ht '5\' 3"'$  (1.6 m)   Wt 184 lb (83.5 kg)   SpO2 97%   BMI 32.59 kg/m         Assessment & Plan:   [name] comes in today with chief complaint of Annual Exam   Diagnosis and orders addressed:  1. Annual physical exam  - CBC with Differential/Platelet - CMP14+EGFR - Lipid panel - Thyroid Panel With TSH - DG Chest 2 View - EKG 12-Lead  2. Gastroesophageal reflux disease without esophagitis Avoid spicy foods Do not eat 2 hours prior to bedtime  - omeprazole (PRILOSEC) 40 MG capsule; Take 1 capsule (40 mg total) by mouth daily.  Dispense: 90 capsule; Refill: 1  3. HSV-1 (herpes simplex virus 1) infection  - valACYclovir (VALTREX) 1000 MG tablet; Take 1 tablet (1,000 mg total) by mouth daily.  Dispense: 90 tablet; Refill: 1  4. BMI 29.0-29.9,adult Discussed diet and exercise for person with BMI >25 Will recheck weight in 3-6 months    Labs pending Health Maintenance reviewed Diet and exercise encouraged  Follow up plan: 6 months   Mary-Margaret Hassell Done, FNP

## 2023-01-01 NOTE — Patient Instructions (Signed)
Exercising to Stay Healthy To become healthy and stay healthy, it is recommended that you do moderate-intensity and vigorous-intensity exercise. You can tell that you are exercising at a moderate intensity if your heart starts beating faster and you start breathing faster but can still hold a conversation. You can tell that you are exercising at a vigorous intensity if you are breathing much harder and faster and cannot hold a conversation while exercising. How can exercise benefit me? Exercising regularly is important. It has many health benefits, such as: Improving overall fitness, flexibility, and endurance. Increasing bone density. Helping with weight control. Decreasing body fat. Increasing muscle strength and endurance. Reducing stress and tension, anxiety, depression, or anger. Improving overall health. What guidelines should I follow while exercising? Before you start a new exercise program, talk with your health care provider. Do not exercise so much that you hurt yourself, feel dizzy, or get very short of breath. Wear comfortable clothes and wear shoes with good support. Drink plenty of water while you exercise to prevent dehydration or heat stroke. Work out until your breathing and your heartbeat get faster (moderate intensity). How often should I exercise? Choose an activity that you enjoy, and set realistic goals. Your health care provider can help you make an activity plan that is individually designed and works best for you. Exercise regularly as told by your health care provider. This may include: Doing strength training two times a week, such as: Lifting weights. Using resistance bands. Push-ups. Sit-ups. Yoga. Doing a certain intensity of exercise for a given amount of time. Choose from these options: A total of 150 minutes of moderate-intensity exercise every week. A total of 75 minutes of vigorous-intensity exercise every week. A mix of moderate-intensity and  vigorous-intensity exercise every week. Children, pregnant women, people who have not exercised regularly, people who are overweight, and older adults may need to talk with a health care provider about what activities are safe to perform. If you have a medical condition, be sure to talk with your health care provider before you start a new exercise program. What are some exercise ideas? Moderate-intensity exercise ideas include: Walking 1 mile (1.6 km) in about 15 minutes. Biking. Hiking. Golfing. Dancing. Water aerobics. Vigorous-intensity exercise ideas include: Walking 4.5 miles (7.2 km) or more in about 1 hour. Jogging or running 5 miles (8 km) in about 1 hour. Biking 10 miles (16.1 km) or more in about 1 hour. Lap swimming. Roller-skating or in-line skating. Cross-country skiing. Vigorous competitive sports, such as football, basketball, and soccer. Jumping rope. Aerobic dancing. What are some everyday activities that can help me get exercise? Yard work, such as: Pushing a lawn mower. Raking and bagging leaves. Washing your car. Pushing a stroller. Shoveling snow. Gardening. Washing windows or floors. How can I be more active in my day-to-day activities? Use stairs instead of an elevator. Take a walk during your lunch break. If you drive, park your car farther away from your work or school. If you take public transportation, get off one stop early and walk the rest of the way. Stand up or walk around during all of your indoor phone calls. Get up, stretch, and walk around every 30 minutes throughout the day. Enjoy exercise with a friend. Support to continue exercising will help you keep a regular routine of activity. Where to find more information You can find more information about exercising to stay healthy from: U.[place]. Department of Health and Human Services: www.hhs.gov Centers for Disease Control and Prevention (  CDC): www.cdc.gov Summary Exercising regularly is  important. It will improve your overall fitness, flexibility, and endurance. Regular exercise will also improve your overall health. It can help you control your weight, reduce stress, and improve your bone density. Do not exercise so much that you hurt yourself, feel dizzy, or get very short of breath. Before you start a new exercise program, talk with your health care provider. This information is not intended to replace advice given to you by your health care provider. Make sure you discuss any questions you have with your health care provider. Document Revised: 02/14/2021 Document Reviewed: 02/14/2021 Elsevier Patient Education  2023 [employer]  

## 2023-01-02 LAB — THYROID PANEL WITH TSH
Free Thyroxine Index: 2.7 (ref 1.2–4.9)
T3 Uptake Ratio: 31 % (ref 24–39)
T4, Total: 8.8 ug/dL (ref 4.5–12.0)
TSH: 1.6 u[IU]/mL (ref 0.450–4.500)

## 2023-01-02 LAB — CMP14+EGFR
ALT: 11 IU/L (ref 0–32)
AST: 19 IU/L (ref 0–40)
Albumin/Globulin Ratio: 2 (ref 1.2–2.2)
Albumin: 4.7 g/dL (ref 3.8–4.9)
Alkaline Phosphatase: 95 IU/L (ref 44–121)
BUN/Creatinine Ratio: 25 — ABNORMAL HIGH (ref 9–23)
BUN: 23 mg/dL (ref 6–24)
Bilirubin Total: 0.2 mg/dL (ref 0.0–1.2)
CO2: 25 mmol/L (ref 20–29)
Calcium: 10.3 mg/dL — ABNORMAL HIGH (ref 8.7–10.2)
Chloride: 102 mmol/L (ref 96–106)
Creatinine, Ser: 0.91 mg/dL (ref 0.57–1.00)
Globulin, Total: 2.3 g/dL (ref 1.5–4.5)
Glucose: 102 mg/dL — ABNORMAL HIGH (ref 70–99)
Potassium: 4.4 mmol/L (ref 3.5–5.2)
Sodium: 140 mmol/L (ref 134–144)
Total Protein: 7 g/dL (ref 6.0–8.5)
eGFR: 73 mL/min/{1.73_m2} (ref >59)

## 2023-01-02 LAB — CBC WITH DIFFERENTIAL/PLATELET
Basophils Absolute: 0.1 10*3/uL (ref 0.0–0.2)
Basos: 1 %
EOS (ABSOLUTE): 0.1 10*3/uL (ref 0.0–0.4)
Eos: 1 %
Hematocrit: 40.4 % (ref 34.0–46.6)
Hemoglobin: 13.7 g/dL (ref 11.1–15.9)
Immature Grans (Abs): 0 10*3/uL (ref 0.0–0.1)
Immature Granulocytes: 0 %
Lymphocytes Absolute: 2 10*3/uL (ref 0.7–3.1)
Lymphs: 31 %
MCH: 30.7 pg (ref 26.6–33.0)
MCHC: 33.9 g/dL (ref 31.5–35.7)
MCV: 91 fL (ref 79–97)
Monocytes Absolute: 0.5 10*3/uL (ref 0.1–0.9)
Monocytes: 7 %
Neutrophils Absolute: 3.9 10*3/uL (ref 1.4–7.0)
Neutrophils: 60 %
Platelets: 250 10*3/uL (ref 150–450)
RBC: 4.46 x10E6/uL (ref 3.77–5.28)
RDW: 12.4 % (ref 11.7–15.4)
WBC: 6.6 10*3/uL (ref 3.4–10.8)

## 2023-01-02 LAB — LIPID PANEL
Chol/HDL Ratio: 2.9 ratio (ref 0.0–4.4)
Cholesterol, Total: 222 mg/dL — ABNORMAL HIGH (ref 100–199)
HDL: 77 mg/dL (ref >39)
LDL Chol Calc (NIH): 135 mg/dL — ABNORMAL HIGH (ref 0–99)
Triglycerides: 55 mg/dL (ref 0–149)
VLDL Cholesterol Cal: 10 mg/dL (ref 5–40)

## 2023-01-05 LAB — CYTOLOGY - PAP
Comment: NEGATIVE
Diagnosis: NEGATIVE
High risk HPV: NEGATIVE

## 2023-04-06 IMAGING — MG DIGITAL SCREENING BREAST BILAT IMPLANT W/ TOMO W/ CAD
9 of 12 series · 9 of 28 positions shown · non-contrast
Comparison: Previous exam(s).

ACR Breast Density Category a: The breast tissue is almost entirely
fatty.

CLINICAL DATA: Screening.

EXAM:
DIGITAL SCREENING BILATERAL MAMMOGRAM WITH IMPLANTS, CAD AND
TOMOSYNTHESIS
TECHNIQUE: Bilateral screening digital craniocaudal and mediolateral oblique
mammograms were obtained. Bilateral screening digital breast
tomosynthesis was performed. The images were evaluated with
computer-aided detection. Standard and/or implant displaced views
were performed.

[L CC]
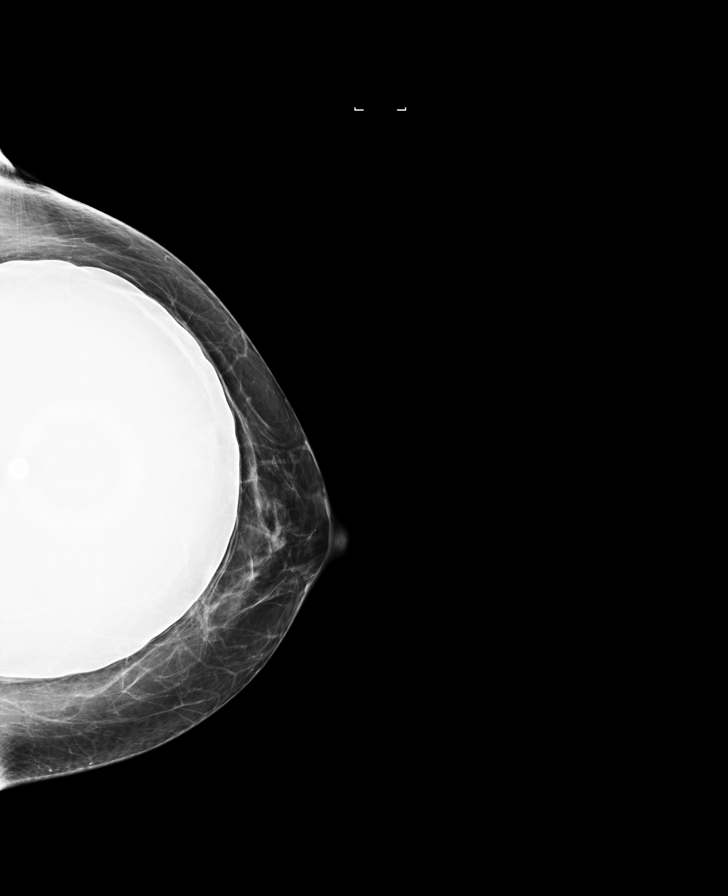

[L MLO]
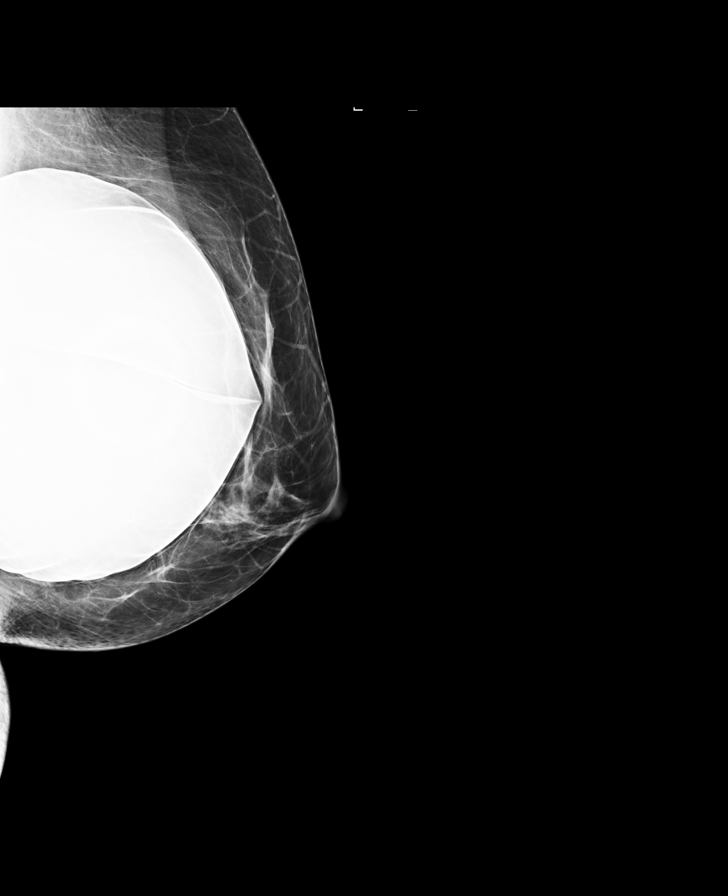

[R CC]
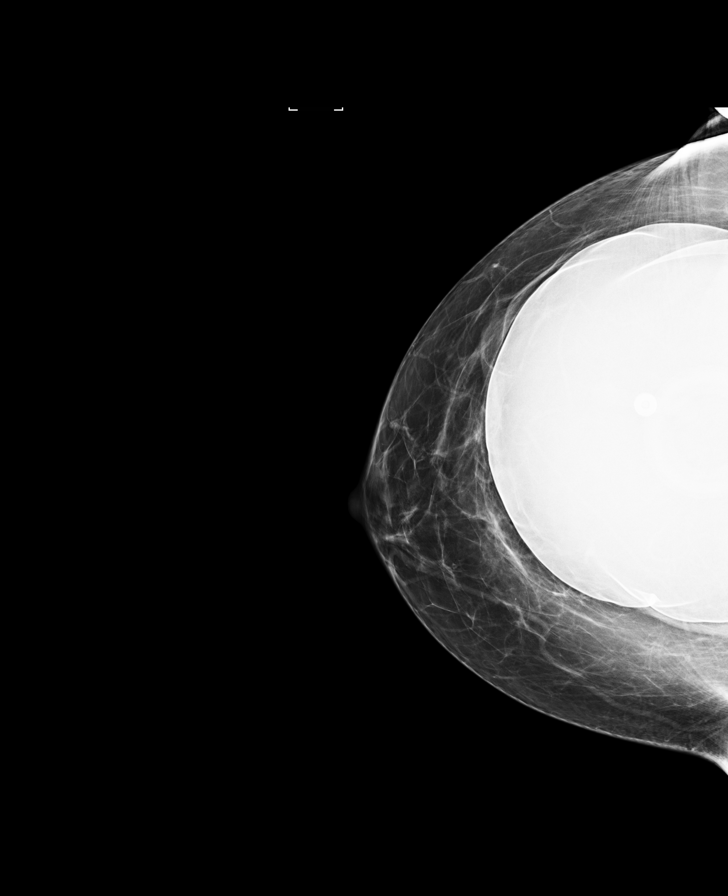

[R MLO]
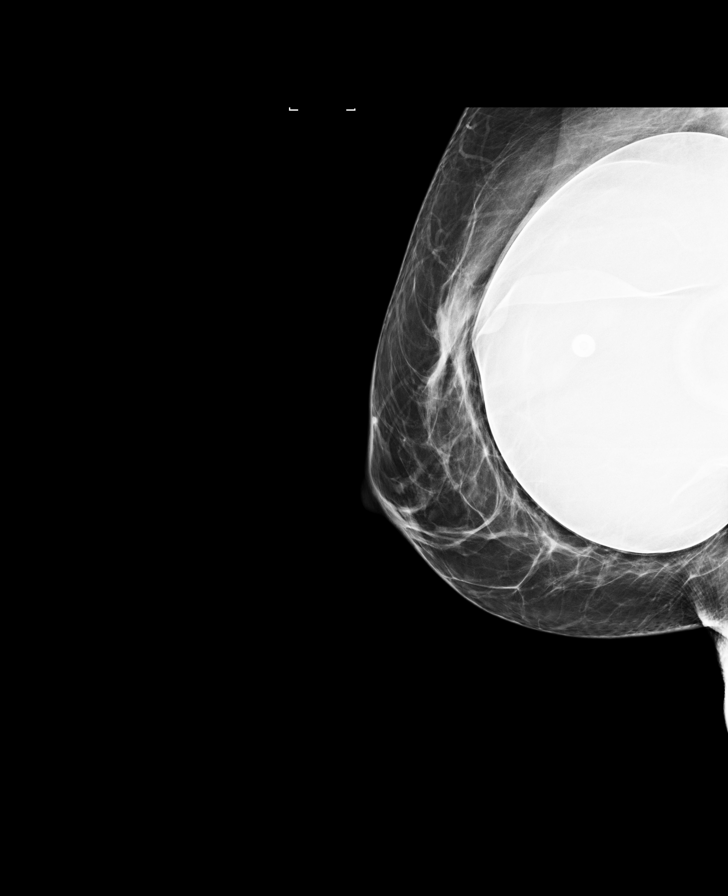

[R CC synth-2D]
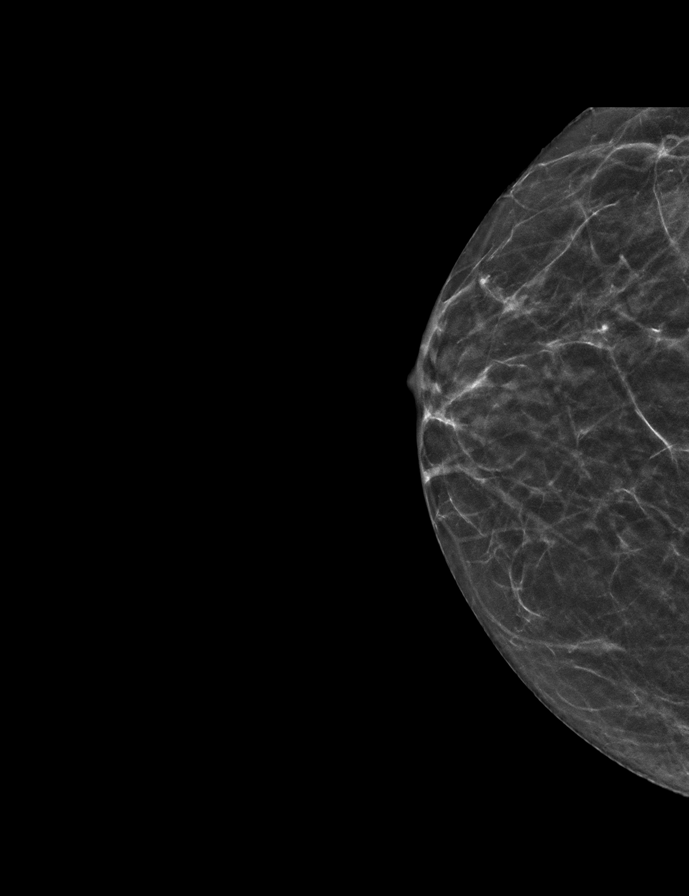

[L CC synth-2D]
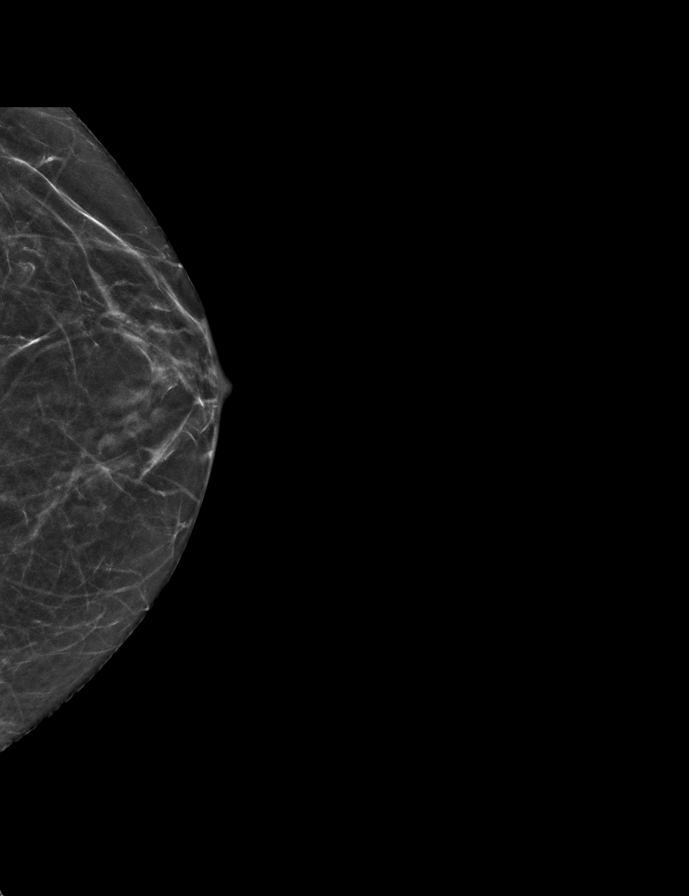

[R MLO synth-2D]
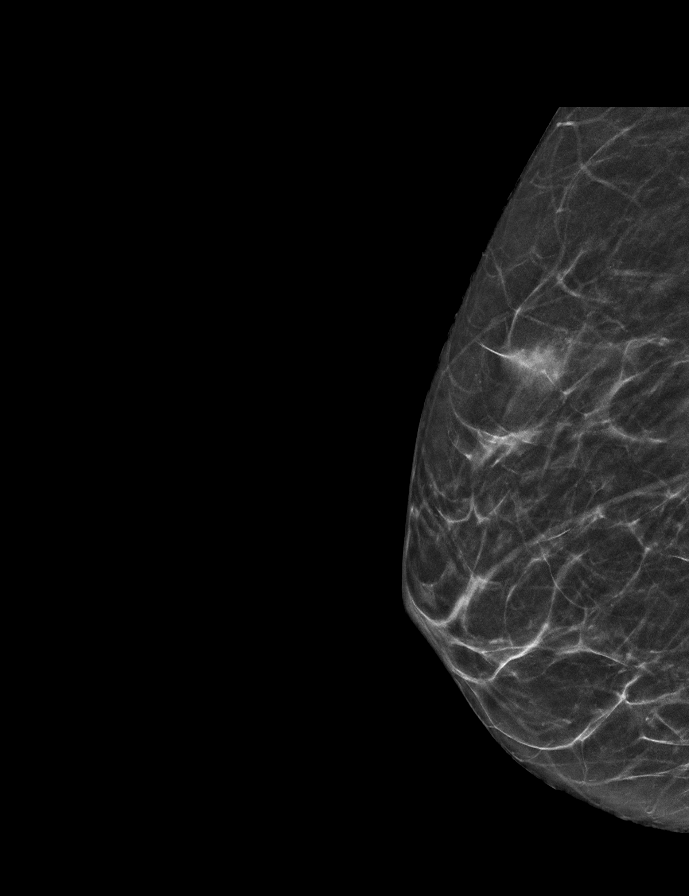

[L MLO synth-2D]
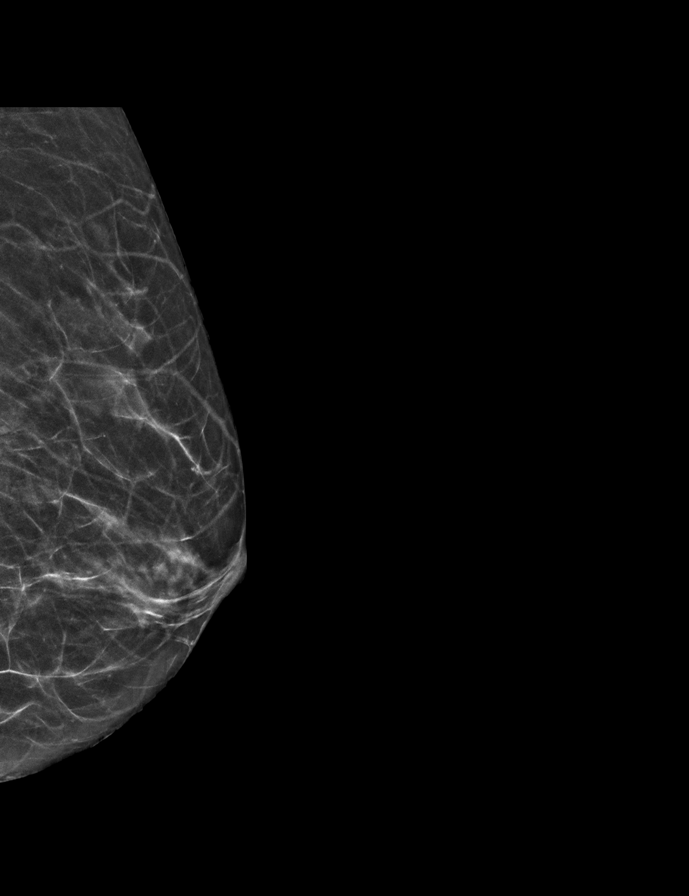

[L CCID BREAST TOMOSYNTHESIS IMAGE tomo · tomo slice 21/41.0]
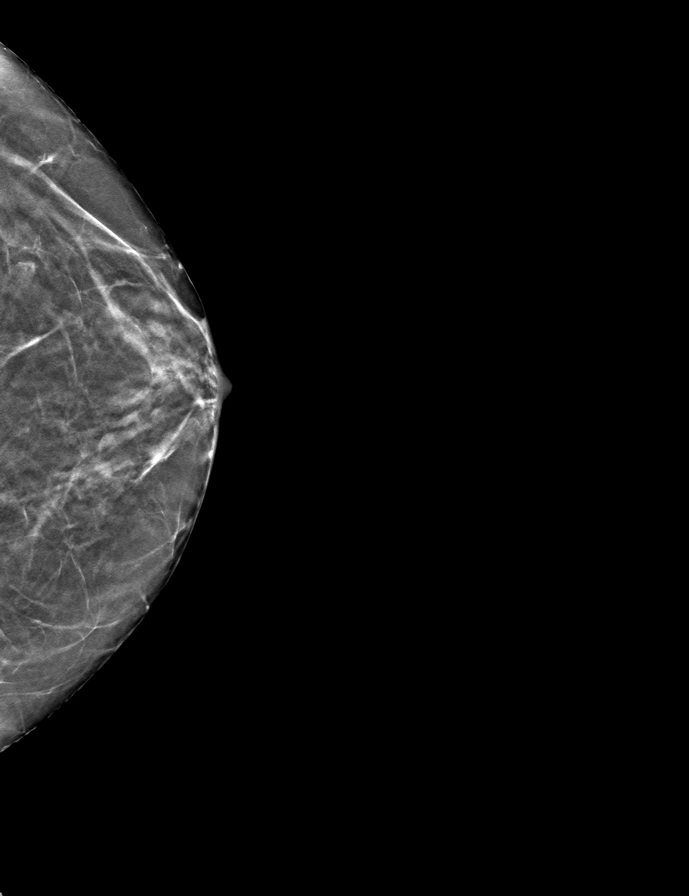

[9 of 28 positions shown; findings below may reference images not displayed]

FINDINGS: The patient has retropectoral implants. There are no findings
suspicious for malignancy.
IMPRESSION: No mammographic evidence of malignancy. A result letter of this
screening mammogram will be mailed directly to the patient.

RECOMMENDATION:
Screening mammogram in one year. (Code:10-T-P7A)

BI-RADS CATEGORY  1:  Negative.

## 2023-08-05 ENCOUNTER — Other Ambulatory Visit: Payer: Self-pay | Admitting: Nurse Practitioner

## 2023-08-05 DIAGNOSIS — Z1231 Encounter for screening mammogram for malignant neoplasm of breast: Secondary | ICD-10-CM

## 2023-09-07 ENCOUNTER — Ambulatory Visit (INDEPENDENT_AMBULATORY_CARE_PROVIDER_SITE_OTHER): Payer: No Typology Code available for payment source | Admitting: Family

## 2023-09-07 VITALS — BP 113/69 | HR 79 | Temp 99.1°F | Ht 63.0 in | Wt 183.2 lb

## 2023-09-07 DIAGNOSIS — R509 Fever, unspecified: Secondary | ICD-10-CM | POA: Diagnosis not present

## 2023-09-07 DIAGNOSIS — J208 Acute bronchitis due to other specified organisms: Secondary | ICD-10-CM

## 2023-09-07 DIAGNOSIS — B9689 Other specified bacterial agents as the cause of diseases classified elsewhere: Secondary | ICD-10-CM

## 2023-09-07 LAB — VERITOR FLU A/B WAIVED
Influenza A: NEGATIVE
Influenza B: NEGATIVE

## 2023-09-07 MED ORDER — PROMETHAZINE-DM 6.25-15 MG/5ML PO SYRP: 5.0000 mL | ORAL_SOLUTION | Freq: Three times a day (TID) | ORAL | 0 refills | Status: DC | PRN

## 2023-09-07 MED ORDER — PREDNISONE 20 MG PO TABS: 40.0000 mg | ORAL_TABLET | Freq: Every day | ORAL | 0 refills | Status: AC

## 2023-09-07 MED ORDER — BENZONATATE 200 MG PO CAPS: 200.0000 mg | ORAL_CAPSULE | Freq: Three times a day (TID) | ORAL | 1 refills | Status: DC | PRN

## 2023-09-07 MED ORDER — AZITHROMYCIN 250 MG PO TABS: ORAL_TABLET | ORAL | 0 refills | Status: DC

## 2023-09-07 NOTE — Progress Notes (Signed)
Subjective:    Patient ID: Hannah Reese, female    DOB: 04-19-1961, 62 y.o.   MRN: [id number]  Chief Complaint  Patient presents with   Cough   Nasal Congestion    week   Pt presents to the office today for cough for over a week ago.  Cough This is a new problem. The current episode started 1 to 4 weeks ago. The problem has been unchanged. The problem occurs constantly. The cough is Non-productive. Associated symptoms include chills (last week), ear congestion, headaches, a sore throat (just from coughing), shortness of breath and wheezing. Pertinent negatives include no ear pain, fever, myalgias (last week), nasal congestion or postnasal drip. She has tried rest and OTC cough suppressant for the symptoms. The treatment provided mild relief.      Review of Systems  Constitutional:  Positive for chills (last week). Negative for fever.  HENT:  Positive for sore throat (just from coughing). Negative for ear pain and postnasal drip.   Respiratory:  Positive for cough, shortness of breath and wheezing.   Musculoskeletal:  Negative for myalgias (last week).  Neurological:  Positive for headaches.  All other systems reviewed and are negative.      Objective:   Physical Exam Vitals reviewed.  Constitutional:      General: She is not in acute distress.    Appearance: She is well-developed.  HENT:     Head: Normocephalic and atraumatic.     Right Ear: Tympanic membrane normal.     Left Ear: Tympanic membrane normal.  Eyes:     Pupils: Pupils are equal, round, and reactive to light.  Neck:     Thyroid: No thyromegaly.  Cardiovascular:     Rate and Rhythm: Normal rate and regular rhythm.     Heart sounds: Normal heart sounds. No murmur heard. Pulmonary:     Effort: Pulmonary effort is normal. No respiratory distress.     Breath sounds: Normal breath sounds. No wheezing.     Comments: Coarse nonproductive cough Abdominal:     General: Bowel sounds are normal. There is no  distension.     Palpations: Abdomen is soft.     Tenderness: There is no abdominal tenderness.  Musculoskeletal:        General: No tenderness. Normal range of motion.     Cervical back: Normal range of motion and neck supple.  Skin:    General: Skin is warm and dry.  Neurological:     Mental Status: She is alert and oriented to person, place, and time.     Cranial Nerves: No cranial nerve deficit.     Deep Tendon Reflexes: Reflexes are normal and symmetric.  Psychiatric:        Behavior: Behavior normal.        Thought Content: Thought content normal.        Judgment: Judgment normal.          BP 113/69   Pulse 79   Temp 99.1 F (37.3 C)   Ht 5\' 3"  (1.6 m)   Wt 183 lb 3.2 oz (83.1 kg)   SpO2 96%   BMI 32.45 kg/m   Assessment & Plan:  TIFFINY WORTHY comes in today with chief complaint of Cough and Nasal Congestion (week)   Diagnosis and orders addressed:  1. Fever, unspecified fever cause - Veritor Flu A/B Waived  2. Acute bacterial bronchitis - Take meds as prescribed - Use a cool mist humidifier  -Use saline  nose sprays frequently -Force fluids -For any cough or congestion  Use plain Mucinex- regular strength or max strength is fine -For fever or aces or pains- take tylenol or ibuprofen. -Throat lozenges if help -Follow up if symptoms worsen or do not improve  - predniSONE (DELTASONE) 20 MG tablet; Take 2 tablets (40 mg total) by mouth daily with breakfast for 5 days.  Dispense: 10 tablet; Refill: 0 - azithromycin (ZITHROMAX) 250 MG tablet; Take 500 mg once, then 250 mg for four days  Dispense: 6 tablet; Refill: 0 - benzonatate (TESSALON) 200 MG capsule; Take 1 capsule (200 mg total) by mouth 3 (three) times daily as needed.  Dispense: 30 capsule; Refill: 1 - promethazine-dextromethorphan (PROMETHAZINE-DM) 6.25-15 MG/5ML syrup; Take 5 mLs by mouth 3 (three) times daily as needed for cough.  Dispense: 118 mL; Refill: 0     Jannifer Rodney, FNP

## 2023-09-07 NOTE — Patient Instructions (Signed)

## 2023-09-28 ENCOUNTER — Other Ambulatory Visit: Payer: Self-pay | Admitting: Nurse Practitioner

## 2023-09-28 DIAGNOSIS — K219 Gastro-esophageal reflux disease without esophagitis: Secondary | ICD-10-CM

## 2023-10-08 ENCOUNTER — Encounter: Payer: Self-pay | Admitting: Nurse Practitioner

## 2023-10-08 ENCOUNTER — Ambulatory Visit (INDEPENDENT_AMBULATORY_CARE_PROVIDER_SITE_OTHER): Payer: No Typology Code available for payment source | Admitting: Nurse Practitioner

## 2023-10-08 VITALS — BP 125/77 | HR 87 | Temp 97.1°F | Ht 63.0 in | Wt 184.0 lb

## 2023-10-08 DIAGNOSIS — Z23 Encounter for immunization: Secondary | ICD-10-CM

## 2023-10-08 DIAGNOSIS — K219 Gastro-esophageal reflux disease without esophagitis: Secondary | ICD-10-CM | POA: Diagnosis not present

## 2023-10-08 DIAGNOSIS — Z6829 Body mass index (BMI) 29.0-29.9, adult: Secondary | ICD-10-CM

## 2023-10-08 DIAGNOSIS — N951 Menopausal and female climacteric states: Secondary | ICD-10-CM

## 2023-10-08 DIAGNOSIS — B009 Herpesviral infection, unspecified: Secondary | ICD-10-CM | POA: Diagnosis not present

## 2023-10-08 DIAGNOSIS — M545 Low back pain, unspecified: Secondary | ICD-10-CM

## 2023-10-08 MED ORDER — PREMPRO 0.3-1.5 MG PO TABS: 1.0000 | ORAL_TABLET | Freq: Every day | ORAL | 1 refills | Status: DC

## 2023-10-08 MED ORDER — VALACYCLOVIR HCL 1 G PO TABS: 1000.0000 mg | ORAL_TABLET | Freq: Every day | ORAL | 1 refills | Status: DC

## 2023-10-08 MED ORDER — CYCLOBENZAPRINE HCL 5 MG PO TABS: 5.0000 mg | ORAL_TABLET | Freq: Three times a day (TID) | ORAL | 1 refills | Status: DC | PRN

## 2023-10-08 MED ORDER — OMEPRAZOLE 40 MG PO CPDR: 40.0000 mg | DELAYED_RELEASE_CAPSULE | Freq: Every day | ORAL | 1 refills | Status: DC

## 2023-10-08 NOTE — Addendum Note (Signed)
Addended by: Bennie Pierini on: 10/08/2023 09:36 AM   Modules accepted: Orders

## 2023-10-08 NOTE — Patient Instructions (Signed)

## 2023-10-08 NOTE — Progress Notes (Signed)
Subjective:    Patient ID: Hannah Reese, female    DOB: 22-Feb-1961, 62 y.o.   MRN: [id number]   Chief Complaint: medical management of chronic issues     HPI:  Hannah Reese is a 62 y.o. who identifies as a female who was assigned female at birth.   Social history: Lives with: boyfriend Work history: works for [employer] in today for follow up of the following chronic medical issues:  1. Gastroesophageal reflux disease without esophagitis Is on omeprazole daily and is doing well.  2. HSV-1 (herpes simplex virus 1) infection Is  on valtrex daily and has had no recent flare ups  3. BMI 29.0-29.9,adult No recent weight changes Wt Readings from Last 3 Encounters:  10/08/23 184 lb (83.5 kg)  09/07/23 183 lb 3.2 oz (83.1 kg)  01/01/23 184 lb (83.5 kg)   BMI Readings from Last 3 Encounters:  10/08/23 32.59 kg/m  09/07/23 32.45 kg/m  01/01/23 32.59 kg/m      New complaints: No menses in several years. Having hot flashes, mood swings and hair loss. Wants to try hormone therapy.  No Known Allergies Outpatient Encounter Medications as of 10/08/2023  Medication Sig   azithromycin (ZITHROMAX) 250 MG tablet Take 500 mg once, then 250 mg for four days   B Complex Vitamins (B-COMPLEX/B-12) TABS    benzonatate (TESSALON) 200 MG capsule Take 1 capsule (200 mg total) by mouth 3 (three) times daily as needed.   Cholecalciferol (VITAMIN D) 1000 UNITS capsule Take 1,000 Units by mouth daily.   cyclobenzaprine (FLEXERIL) 5 MG tablet Take 1 tablet (5 mg total) by mouth 3 (three) times daily as needed for muscle spasms.   ibuprofen (ADVIL,MOTRIN) 800 MG tablet Take 1 tablet (800 mg total) by mouth every 8 (eight) hours as needed for pain.   Loratadine 10 MG CAPS    Multiple Vitamins-Minerals (ICAPS AREDS 2 PO)    omeprazole (PRILOSEC) 40 MG capsule Take 1 capsule (40 mg total) by mouth daily. **NEEDS TO BE SEEN BEFORE NEXT REFILL**   promethazine-dextromethorphan  (PROMETHAZINE-DM) 6.25-15 MG/5ML syrup Take 5 mLs by mouth 3 (three) times daily as needed for cough.   valACYclovir (VALTREX) 1000 MG tablet Take 1 tablet (1,000 mg total) by mouth daily.   No facility-administered encounter medications on file as of 10/08/2023.    Past Surgical History:  Procedure Laterality Date   ABLATION  2012   AUGMENTATION MAMMAPLASTY Bilateral    BLADDER SUSPENSION  06/12/2011   Procedure: TRANSVAGINAL TAPE (TVT) PROCEDURE;  Surgeon: Mickel Baas;  Location: [facility];  Service: Gynecology;  Laterality: N/A;  Transobturator   BREAST SURGERY     TUBAL LIGATION      Family History  Problem Relation Age of Onset   Alzheimer's disease Mother    Hypertension Father    Heart disease Father    Cancer Father    Healthy Sister    Early death Brother    Healthy Sister    Colon cancer Neg Hx    Breast cancer Neg Hx       Controlled substance contract: n/a     Review of Systems  Constitutional:  Negative for diaphoresis.  Eyes:  Negative for pain.  Respiratory:  Negative for shortness of breath.   Cardiovascular:  Negative for chest pain, palpitations and leg swelling.  Gastrointestinal:  Negative for abdominal pain.  Endocrine: Negative for polydipsia.  Skin:  Negative for rash.  Neurological:  Negative for dizziness,  weakness and headaches.  Hematological:  Does not bruise/bleed easily.  All other systems reviewed and are negative.      Objective:   Physical Exam Vitals and nursing note reviewed.  Constitutional:      General: She is not in acute distress.    Appearance: Normal appearance. She is well-developed.  HENT:     Head: Normocephalic.     Right Ear: Tympanic membrane normal.     Left Ear: Tympanic membrane normal.     Nose: Nose normal.     Mouth/Throat:     Mouth: Mucous membranes are moist.  Eyes:     Pupils: Pupils are equal, round, and reactive to light.  Neck:     Vascular: No carotid bruit or JVD.  Cardiovascular:      Rate and Rhythm: Normal rate and regular rhythm.     Heart sounds: Normal heart sounds.  Pulmonary:     Effort: Pulmonary effort is normal. No respiratory distress.     Breath sounds: Normal breath sounds. No wheezing or rales.  Chest:     Chest wall: No tenderness.  Abdominal:     General: Bowel sounds are normal. There is no distension or abdominal bruit.     Palpations: Abdomen is soft. There is no hepatomegaly, splenomegaly, mass or pulsatile mass.     Tenderness: There is no abdominal tenderness.  Musculoskeletal:        General: Normal range of motion.     Cervical back: Normal range of motion and neck supple.  Lymphadenopathy:     Cervical: No cervical adenopathy.  Skin:    General: Skin is warm and dry.  Neurological:     Mental Status: She is alert and oriented to person, place, and time.     Deep Tendon Reflexes: Reflexes are normal and symmetric.  Psychiatric:        Behavior: Behavior normal.        Thought Content: Thought content normal.        Judgment: Judgment normal.     BP 125/77   Pulse 87   Temp (!) 97.1 F (36.2 C) (Temporal)   Ht 5\' 3"  (1.6 m)   Wt 184 lb (83.5 kg)   SpO2 97%   BMI 32.59 kg/m        Assessment & Plan:  Reed Breech in today with chief complaint of Medical Management of Chronic Issues   1. Gastroesophageal reflux disease without esophagitis Avoid spicy foods Do not eat 2 hours prior to bedtime - omeprazole (PRILOSEC) 40 MG capsule; Take 1 capsule (40 mg total) by mouth daily. **NEEDS TO BE SEEN BEFORE NEXT REFILL**  Dispense: 90 capsule; Refill: 1  2. HSV-1 (herpes simplex virus 1) infection - valACYclovir (VALTREX) 1000 MG tablet; Take 1 tablet (1,000 mg total) by mouth daily.  Dispense: 90 tablet; Refill: 1  3. BMI 29.0-29.9,adult Discussed diet and exercise for person with BMI >25 Will recheck weight in 3-6 months   4. Vasomotor symptoms due to menopause Had long discussion about hormone replacement and risks of  breast cancer and uterine cancer Patient is to do yearly pap and mammogram while on meds. - estrogen, conjugated,-medroxyprogesterone (PREMPRO) 0.3-1.5 MG tablet; Take 1 tablet by mouth daily.  Dispense: 90 tablet; Refill: 1    The above assessment and management plan was discussed with the patient. The patient verbalized understanding of and has agreed to the management plan. Patient is aware to call the clinic if symptoms persist or  worsen. Patient is aware when to return to the clinic for a follow-up visit. Patient educated on when it is appropriate to go to the emergency department.   Mary-Margaret Daphine Deutscher, FNP

## 2023-10-08 NOTE — Addendum Note (Signed)
Addended by: Bennie Pierini on: 10/08/2023 09:33 AM   Modules accepted: Level of Service

## 2023-10-22 ENCOUNTER — Other Ambulatory Visit: Payer: Self-pay | Admitting: Nurse Practitioner

## 2023-10-22 ENCOUNTER — Ambulatory Visit
Admission: RE | Admit: 2023-10-22 | Discharge: 2023-10-22 | Disposition: A | Discharge status: Home / Self Care | Payer: No Typology Code available for payment source | Source: Ambulatory Visit | Attending: Nurse Practitioner | Admitting: Nurse Practitioner

## 2023-10-22 DIAGNOSIS — Z1231 Encounter for screening mammogram for malignant neoplasm of breast: Secondary | ICD-10-CM

## 2024-03-31 ENCOUNTER — Ambulatory Visit: Payer: No Typology Code available for payment source | Admitting: Nurse Practitioner

## 2024-04-04 ENCOUNTER — Encounter: Payer: Self-pay | Admitting: Nurse Practitioner

## 2024-04-04 ENCOUNTER — Other Ambulatory Visit: Payer: Self-pay | Admitting: Nurse Practitioner

## 2024-04-04 ENCOUNTER — Ambulatory Visit (INDEPENDENT_AMBULATORY_CARE_PROVIDER_SITE_OTHER): Admitting: Nurse Practitioner

## 2024-04-04 VITALS — BP 114/72 | HR 72 | Temp 97.8°F | Ht 63.0 in | Wt 187.0 lb

## 2024-04-04 DIAGNOSIS — B009 Herpesviral infection, unspecified: Secondary | ICD-10-CM | POA: Diagnosis not present

## 2024-04-04 DIAGNOSIS — Z6829 Body mass index (BMI) 29.0-29.9, adult: Secondary | ICD-10-CM

## 2024-04-04 DIAGNOSIS — K219 Gastro-esophageal reflux disease without esophagitis: Secondary | ICD-10-CM

## 2024-04-04 LAB — LIPID PANEL

## 2024-04-04 MED ORDER — VALACYCLOVIR HCL 1 G PO TABS: 1000.0000 mg | ORAL_TABLET | Freq: Every day | ORAL | 1 refills | Status: DC

## 2024-04-04 MED ORDER — OMEPRAZOLE 40 MG PO CPDR: 40.0000 mg | DELAYED_RELEASE_CAPSULE | Freq: Every day | ORAL | 1 refills | Status: DC

## 2024-04-04 NOTE — Patient Instructions (Signed)

## 2024-04-04 NOTE — Progress Notes (Signed)
 Subjective:    Patient ID: Hannah Reese, female    DOB: [birth year], 63 y.o.   MRN: [id number]   Chief Complaint: medical management of chronic issues     HPI:  Hannah Reese is a 63 y.o. who identifies as a female who was assigned female at birth.   Social history: Lives with: boyfriend Work history: works for [employer] in today for follow up of the following chronic medical issues:  1. Gastroesophageal reflux disease without esophagitis Is on omeprazole  daily and is doing well.  2. 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 BMI 29.0-29.9,adult Weight is up 3 lbs  Wt Readings from Last 3 Encounters:  04/04/24 187 lb (84.8 kg)  10/08/23 184 lb (83.5 kg)  09/07/23 183 lb 3.2 oz (83.1 kg)   BMI Readings from Last 3 Encounters:  04/04/24 33.13 kg/m  10/08/23 32.59 kg/m  09/07/23 32.45 kg/m       New complaints: None today  No Known Allergies Outpatient Encounter Medications as of 04/04/2024  Medication Sig   B Complex Vitamins (B-COMPLEX/B-12) TABS    Cholecalciferol (VITAMIN D) 1000 UNITS capsule Take 1,000 Units by mouth daily.   cyclobenzaprine  (FLEXERIL ) 5 MG tablet Take 1 tablet (5 mg total) by mouth 3 (three) times daily as needed for muscle spasms.   estrogen, conjugated,-medroxyprogesterone (PREMPRO ) 0.3-1.5 MG tablet Take 1 tablet by mouth daily.   ibuprofen  (ADVIL ,MOTRIN ) 800 MG tablet Take 1 tablet (800 mg total) by mouth every 8 (eight) hours as needed for pain.   Loratadine 10 MG CAPS    Multiple Vitamins-Minerals (ICAPS AREDS 2 PO)    omeprazole  (PRILOSEC) 40 MG capsule Take 1 capsule (40 mg total) by mouth daily. **NEEDS TO BE SEEN BEFORE NEXT REFILL**   valACYclovir  (VALTREX ) 1000 MG tablet Take 1 tablet (1,000 mg total) by mouth daily.   No facility-administered encounter medications on file as of 04/04/2024.    Past Surgical History:  Procedure Laterality Date   ABLATION  2012    AUGMENTATION MAMMAPLASTY Bilateral    BLADDER SUSPENSION  06/12/2011   Procedure: TRANSVAGINAL TAPE (TVT) PROCEDURE;  Surgeon: Carvel Clarity;  Location: [facility];  Service: Gynecology;  Laterality: N/A;  Transobturator   BREAST SURGERY     TUBAL LIGATION      Family History  Problem Relation Age of Onset   Alzheimer's disease Mother    Hypertension Father    Heart disease Father    Cancer Father    Healthy Sister    Early death Brother    Healthy Sister    Colon cancer Neg Hx    Breast cancer Neg Hx       Controlled substance contract: n/a     Review of Systems  Constitutional:  Negative for diaphoresis.  Eyes:  Negative for pain.  Respiratory:  Negative for shortness of breath.   Cardiovascular:  Negative for chest pain, palpitations and leg swelling.  Gastrointestinal:  Negative for abdominal pain.  Endocrine: Negative for polydipsia.  Skin:  Negative for rash.  Neurological:  Negative for dizziness, weakness and headaches.  Hematological:  Does not bruise/bleed easily.  All other systems reviewed and are negative.      Objective:   Physical Exam Vitals and nursing note reviewed.  Constitutional:      General: She is not in acute distress.    Appearance: Normal appearance. She is well-developed.  HENT:     Head: Normocephalic.     Right Ear: Tympanic membrane normal.     Left Ear: Tympanic membrane normal.     Nose: Nose normal.     Mouth/Throat:     Mouth: Mucous membranes are moist.  Eyes:     Pupils: Pupils are equal, round, and reactive to light.  Neck:     Vascular: No carotid bruit or JVD.  Cardiovascular:     Rate and Rhythm: Normal rate and regular rhythm.     Heart sounds: Normal heart sounds.  Pulmonary:     Effort: Pulmonary effort is normal. No respiratory distress.     Breath sounds: Normal breath sounds. No wheezing or rales.  Chest:     Chest wall: No tenderness.  Abdominal:     General: Bowel sounds are normal. There is no  distension or abdominal bruit.     Palpations: Abdomen is soft. There is no hepatomegaly, splenomegaly, mass or pulsatile mass.     Tenderness: There is no abdominal tenderness.  Musculoskeletal:        General: Normal range of motion.     Cervical back: Normal range of motion and neck supple.  Lymphadenopathy:     Cervical: No cervical adenopathy.  Skin:    General: Skin is warm and dry.  Neurological:     Mental Status: She is alert and oriented to person, place, and time.     Deep Tendon Reflexes: Reflexes are normal and symmetric.  Psychiatric:        Behavior: Behavior normal.        Thought Content: Thought content normal.        Judgment: Judgment normal.     BP 114/72   Pulse 72   Temp 97.8 F (36.6 C) (Temporal)   Ht 5\' 3"  (1.6 m)   Wt 187 lb (84.8 kg)   SpO2 99%   BMI 33.13 kg/m         Assessment & Plan:  Hannah Reese in today with chief complaint of No chief complaint on file.   1. Gastroesophageal reflux disease without esophagitis Avoid spicy foods Do not eat 2 hours prior to bedtime - omeprazole  (PRILOSEC) 40 MG capsule; Take 1 capsule (40 mg total) by mouth daily. **NEEDS TO BE SEEN BEFORE NEXT REFILL**  Dispense: 90 capsule; Refill: 1  2. HSV-1 (herpes simplex virus 1) infection - valACYclovir  (VALTREX ) 1000 MG tablet; Take 1 tablet (1,000 mg total) by mouth daily.  Dispense: 90 tablet; Refill: 1  3. BMI 29.0-29.9,adult Discussed diet and exercise for person with BMI >25 Will recheck weight in 3-6 months     The above assessment and management plan was discussed with the patient. The patient verbalized understanding of and has agreed to the management plan. Patient is aware to call the clinic if symptoms persist or worsen. Patient is aware when to return to the clinic for a follow-up visit. Patient educated on when it is appropriate to go to the emergency department.   Mary-Margaret Gaylyn Keas, FNP

## 2024-04-05 LAB — CMP14+EGFR
ALT: 13 IU/L (ref 0–32)
AST: 28 IU/L (ref 0–40)
Albumin: 4.7 g/dL (ref 3.8–4.9)
Alkaline Phosphatase: 106 IU/L (ref 44–121)
BUN/Creatinine Ratio: 25 (ref 12–28)
BUN: 23 mg/dL (ref 8–27)
Bilirubin Total: 0.2 mg/dL (ref 0.0–1.2)
CO2: 22 mmol/L (ref 20–29)
Calcium: 9.4 mg/dL (ref 8.7–10.3)
Chloride: 102 mmol/L (ref 96–106)
Creatinine, Ser: 0.92 mg/dL (ref 0.57–1.00)
Globulin, Total: 2 g/dL (ref 1.5–4.5)
Glucose: 101 mg/dL — ABNORMAL HIGH (ref 70–99)
Potassium: 4.1 mmol/L (ref 3.5–5.2)
Sodium: 142 mmol/L (ref 134–144)
Total Protein: 6.7 g/dL (ref 6.0–8.5)
eGFR: 71 mL/min/{1.73_m2} (ref >59)

## 2024-04-05 LAB — CBC WITH DIFFERENTIAL/PLATELET
Basophils Absolute: 0.1 10*3/uL (ref 0.0–0.2)
Basos: 1 %
EOS (ABSOLUTE): 0.1 10*3/uL (ref 0.0–0.4)
Eos: 2 %
Hematocrit: 41.7 % (ref 34.0–46.6)
Hemoglobin: 13.7 g/dL (ref 11.1–15.9)
Immature Grans (Abs): 0 10*3/uL (ref 0.0–0.1)
Immature Granulocytes: 0 %
Lymphocytes Absolute: 1.9 10*3/uL (ref 0.7–3.1)
Lymphs: 27 %
MCH: 31.6 pg (ref 26.6–33.0)
MCHC: 32.9 g/dL (ref 31.5–35.7)
MCV: 96 fL (ref 79–97)
Monocytes Absolute: 0.3 10*3/uL (ref 0.1–0.9)
Monocytes: 4 %
Neutrophils Absolute: 4.7 10*3/uL (ref 1.4–7.0)
Neutrophils: 66 %
Platelets: 258 10*3/uL (ref 150–450)
RBC: 4.34 x10E6/uL (ref 3.77–5.28)
RDW: 12.6 % (ref 11.7–15.4)
WBC: 7.1 10*3/uL (ref 3.4–10.8)

## 2024-04-05 LAB — LIPID PANEL
Cholesterol, Total: 220 mg/dL — ABNORMAL HIGH (ref 100–199)
HDL: 64 mg/dL (ref >39)
LDL CALC COMMENT:: 3.4 ratio (ref 0.0–4.4)
LDL Chol Calc (NIH): 133 mg/dL — ABNORMAL HIGH (ref 0–99)
Triglycerides: 129 mg/dL (ref 0–149)
VLDL Cholesterol Cal: 23 mg/dL (ref 5–40)

## 2024-04-07 ENCOUNTER — Ambulatory Visit: Payer: Self-pay | Admitting: Nurse Practitioner

## 2024-07-25 ENCOUNTER — Encounter: Payer: Self-pay | Admitting: Internal Medicine

## 2024-08-21 ENCOUNTER — Encounter: Payer: Self-pay | Admitting: Internal Medicine

## 2024-09-22 ENCOUNTER — Ambulatory Visit (AMBULATORY_SURGERY_CENTER)

## 2024-09-22 VITALS — Ht 63.0 in | Wt 168.0 lb

## 2024-09-22 DIAGNOSIS — Z1211 Encounter for screening for malignant neoplasm of colon: Secondary | ICD-10-CM

## 2024-09-22 MED ORDER — NA SULFATE-K SULFATE-MG SULF 17.5-3.13-1.6 GM/177ML PO SOLN: 1.0000 | Freq: Once | ORAL | 0 refills | Status: AC

## 2024-09-22 NOTE — Progress Notes (Signed)
 No egg or soy allergy known to patient  No issues known to pt with past sedation with any surgeries or procedures Patient denies ever being told they had issues or difficulty with intubation  No FH of Malignant Hyperthermia Pt is not on diet pills Pt is not on  home 02  Pt is not on blood thinners  Pt denies issues with constipation  No A fib or A flutter Have any cardiac testing pending--NO Pt can ambulate- Independently Pt denies use of chewing tobacco Discussed diabetic I weight loss medication holds Discussed NSAID holds Checked BMI Pt instructed to use Single[employer]com or GoodRx for a price reduction on prep  Patient's chart reviewed by Cathlyn Parsons CNRA prior to previsit and patient appropriate for the [facility].  Pre visit completed and red dot placed by patient's name on their procedure day (on provider's schedule).

## 2024-10-03 ENCOUNTER — Ambulatory Visit: Payer: Self-pay | Admitting: Nurse Practitioner

## 2024-10-03 ENCOUNTER — Encounter: Payer: Self-pay | Admitting: Nurse Practitioner

## 2024-10-03 VITALS — BP 111/72 | HR 74 | Temp 97.6°F | Ht 63.0 in | Wt 169.0 lb

## 2024-10-03 DIAGNOSIS — Z23 Encounter for immunization: Secondary | ICD-10-CM

## 2024-10-03 DIAGNOSIS — B009 Herpesviral infection, unspecified: Secondary | ICD-10-CM | POA: Diagnosis not present

## 2024-10-03 DIAGNOSIS — Z6829 Body mass index (BMI) 29.0-29.9, adult: Secondary | ICD-10-CM | POA: Diagnosis not present

## 2024-10-03 DIAGNOSIS — K219 Gastro-esophageal reflux disease without esophagitis: Secondary | ICD-10-CM | POA: Diagnosis not present

## 2024-10-03 DIAGNOSIS — M545 Low back pain, unspecified: Secondary | ICD-10-CM

## 2024-10-03 DIAGNOSIS — M79675 Pain in left toe(s): Secondary | ICD-10-CM

## 2024-10-03 MED ORDER — CYCLOBENZAPRINE HCL 5 MG PO TABS: 5.0000 mg | ORAL_TABLET | Freq: Three times a day (TID) | ORAL | 1 refills | Status: AC | PRN

## 2024-10-03 MED ORDER — OMEPRAZOLE 40 MG PO CPDR: 40.0000 mg | DELAYED_RELEASE_CAPSULE | Freq: Every day | ORAL | 1 refills | Status: AC

## 2024-10-03 MED ORDER — VALACYCLOVIR HCL 1 G PO TABS: 1000.0000 mg | ORAL_TABLET | Freq: Every day | ORAL | 1 refills | Status: AC

## 2024-10-03 NOTE — Progress Notes (Signed)
 Subjective:    Patient ID: Hannah Reese, female    DOB: 01/28/1961, 63 y.o.   MRN: [id number]   Chief Complaint: medical management of chronic issues     HPI:  Hannah Reese is a 63 y.o. who identifies as a female who was assigned female at birth.   Social history: Lives with: boyfriend Work history: works for [employer] in today for follow up of the following chronic medical issues:  1. Gastroesophageal reflux disease without esophagitis Is on omeprazole  daily and is doing well.  2. 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 BMI 29.0-29.9,adult Weight  is down 19lbs Wt Readings from Last 3 Encounters:  09/22/24 168 lb (76.2 kg)  04/04/24 187 lb (84.8 kg)  10/08/23 184 lb (83.5 kg)   BMI Readings from Last 3 Encounters:  09/22/24 29.76 kg/m  04/04/24 33.13 kg/m  10/08/23 32.59 kg/m      New complaints: Having left toe pain- left 2nd and 3rd toe almost feel like they are cramping. Has to curl toes under in order to walk. Saw Dr. Roddie and he gave her an injection , but did not help.  No Known Allergies Outpatient Encounter Medications as of 10/03/2024  Medication Sig   B Complex Vitamins (B-COMPLEX/B-12) TABS    Cholecalciferol (VITAMIN D) 1000 UNITS capsule Take 1,000 Units by mouth daily.   cyclobenzaprine  (FLEXERIL ) 5 MG tablet Take 1 tablet (5 mg total) by mouth 3 (three) times daily as needed for muscle spasms.   ibuprofen  (ADVIL ,MOTRIN ) 800 MG tablet Take 1 tablet (800 mg total) by mouth every 8 (eight) hours as needed for pain.   Loratadine 10 MG CAPS    Magnesium Oxide -Mg Supplement (MAGNESIUM EXTRA STRENGTH) 400 MG CAPS    Multiple Vitamins-Minerals (ICAPS AREDS 2 PO)    omeprazole  (PRILOSEC) 40 MG capsule Take 1 capsule (40 mg total) by mouth daily.   SEMAGLUTIDE     valACYclovir  (VALTREX ) 1000 MG tablet Take 1 tablet (1,000 mg total) by mouth daily.   No facility-administered  encounter medications on file as of 10/03/2024.    Past Surgical History:  Procedure Laterality Date   ABLATION  2012   AUGMENTATION MAMMAPLASTY Bilateral    BLADDER SUSPENSION  06/12/2011   Procedure: TRANSVAGINAL TAPE (TVT) PROCEDURE;  Surgeon: Charlie JONETTA Aho;  Location: [facility];  Service: Gynecology;  Laterality: N/A;  Transobturator   BREAST SURGERY     TUBAL LIGATION      Family History  Problem Relation Age of Onset   Alzheimer's disease Mother    Hypertension Father    Heart disease Father    Cancer Father    Healthy Sister    Healthy Sister    Early death Brother    Colon cancer Neg Hx    Breast cancer Neg Hx    Esophageal cancer Neg Hx    Rectal cancer Neg Hx    Stomach cancer Neg Hx       Controlled substance contract: n/a     Review of Systems  Constitutional:  Negative for diaphoresis.  Eyes:  Negative for pain.  Respiratory:  Negative for shortness of breath.   Cardiovascular:  Negative for chest pain, palpitations and leg swelling.  Gastrointestinal:  Negative for abdominal pain.  Endocrine: Negative for polydipsia.  Skin:  Negative for rash.  Neurological:  Negative for dizziness, weakness and headaches.  Hematological:  Does not  bruise/bleed easily.  All other systems reviewed and are negative.      Objective:   Physical Exam Vitals and nursing note reviewed.  Constitutional:      General: She is not in acute distress.    Appearance: Normal appearance. She is well-developed.  HENT:     Head: Normocephalic.     Right Ear: Tympanic membrane normal.     Left Ear: Tympanic membrane normal.     Nose: Nose normal.     Mouth/Throat:     Mouth: Mucous membranes are moist.  Eyes:     Pupils: Pupils are equal, round, and reactive to light.  Neck:     Vascular: No carotid bruit or JVD.  Cardiovascular:     Rate and Rhythm: Normal rate and regular rhythm.     Heart sounds: Normal heart sounds.  Pulmonary:     Effort: Pulmonary effort is  normal. No respiratory distress.     Breath sounds: Normal breath sounds. No wheezing or rales.  Chest:     Chest wall: No tenderness.  Abdominal:     General: Bowel sounds are normal. There is no distension or abdominal bruit.     Palpations: Abdomen is soft. There is no hepatomegaly, splenomegaly, mass or pulsatile mass.     Tenderness: There is no abdominal tenderness.  Musculoskeletal:        General: Normal range of motion.     Cervical back: Normal range of motion and neck supple.  Lymphadenopathy:     Cervical: No cervical adenopathy.  Skin:    General: Skin is warm and dry.  Neurological:     Mental Status: She is alert and oriented to person, place, and time.     Deep Tendon Reflexes: Reflexes are normal and symmetric.  Psychiatric:        Behavior: Behavior normal.        Thought Content: Thought content normal.        Judgment: Judgment normal.     BP 111/72   Pulse 74   Temp 97.6 F (36.4 C) (Temporal)   Ht 5' 3 (1.6 m)   Wt 169 lb (76.7 kg)   SpO2 100%   BMI 29.94 kg/m         Assessment & Plan:  Damien GORMAN Croak in today with chief complaint of Medical Management of Chronic Issues   1. Gastroesophageal reflux disease without esophagitis Avoid spicy foods Do not eat 2 hours prior to bedtime - omeprazole  (PRILOSEC) 40 MG capsule; Take 1 capsule (40 mg total) by mouth daily. **NEEDS TO BE SEEN BEFORE NEXT REFILL**  Dispense: 90 capsule; Refill: 1  2. HSV-1 (herpes simplex virus 1) infection - valACYclovir  (VALTREX ) 1000 MG tablet; Take 1 tablet (1,000 mg total) by mouth daily.  Dispense: 90 tablet; Refill: 1  3. BMI 29.0-29.9,adult Discussed diet and exercise for person with BMI >25 Will recheck weight in 3-6 months  4. Left foot pain Referral to ortho   The above assessment and management plan was discussed with the patient. The patient verbalized understanding of and has agreed to the management plan. Patient is aware to call the clinic if  symptoms persist or worsen. Patient is aware when to return to the clinic for a follow-up visit. Patient educated on when it is appropriate to go to the emergency department.   Mary-Margaret Gladis, FNP

## 2024-10-05 ENCOUNTER — Encounter: Payer: Self-pay | Admitting: Internal Medicine

## 2024-10-13 ENCOUNTER — Encounter: Payer: Self-pay | Admitting: Internal Medicine

## 2024-10-13 ENCOUNTER — Ambulatory Visit: Admitting: Internal Medicine

## 2024-10-13 VITALS — BP 110/61 | HR 63 | Temp 97.3°F | Resp 13 | Ht 63.0 in | Wt 168.0 lb

## 2024-10-13 DIAGNOSIS — Z1211 Encounter for screening for malignant neoplasm of colon: Secondary | ICD-10-CM | POA: Diagnosis present

## 2024-10-13 DIAGNOSIS — K648 Other hemorrhoids: Secondary | ICD-10-CM | POA: Diagnosis not present

## 2024-10-13 MED ORDER — SODIUM CHLORIDE 0.9 % IV SOLN: 500.0000 mL | Freq: Once | INTRAVENOUS | Status: DC

## 2024-10-13 NOTE — Progress Notes (Signed)
 Report to PACU, RN, vss, BBS= Clear.

## 2024-10-13 NOTE — Patient Instructions (Addendum)
 Hemorrhoids were swollen, otherwise no polyps or cancer were seen.  Next routine colonoscopy or other screening test in 10 years - 2035.  I appreciate the opportunity to care for you. Hannah CHARLENA Commander, MD, [facility]  Handouts given: Hemorrhoids Resume previous diet. Continue present medications.    YOU HAD AN ENDOSCOPIC PROCEDURE TODAY AT THE [facility]:   Refer to the procedure report that was given to you for any specific questions about what was found during the examination.  If the procedure report does not answer your questions, please call your gastroenterologist to clarify.  If you requested that your care partner not be given the details of your procedure findings, then the procedure report has been included in a sealed envelope for you to review at your convenience later.  YOU SHOULD EXPECT: Some feelings of bloating in the abdomen. Passage of more gas than usual.  Walking can help get rid of the air that was put into your GI tract during the procedure and reduce the bloating. If you had a lower endoscopy (such as a colonoscopy or flexible sigmoidoscopy) you may notice spotting of blood in your stool or on the toilet paper. If you underwent a bowel prep for your procedure, you may not have a normal bowel movement for a few days.  Please Note:  You might notice some irritation and congestion in your nose or some drainage.  This is from the oxygen used during your procedure.  There is no need for concern and it should clear up in a day or so.  SYMPTOMS TO REPORT IMMEDIATELY:  Following lower endoscopy (colonoscopy or flexible sigmoidoscopy):  Excessive amounts of blood in the stool  Significant tenderness or worsening of abdominal pains  Swelling of the abdomen that is new, acute  Fever of 100F or higher  For urgent or emergent issues, a gastroenterologist can be reached at any hour by calling [phone]. Do not use MyChart messaging for urgent concerns.    DIET:  We  do recommend a small meal at first, but then you may proceed to your regular diet.  Drink plenty of fluids but you should avoid alcoholic beverages for 24 hours.  ACTIVITY:  You should plan to take it easy for the rest of today and you should NOT DRIVE or use heavy machinery until tomorrow (because of the sedation medicines used during the test).    FOLLOW UP: Our staff will call the number listed on your records the next business day following your procedure.  We will call around 7:15- 8:00 am to check on you and address any questions or concerns that you may have regarding the information given to you following your procedure. If we do not reach you, we will leave a message.     If any biopsies were taken you will be contacted by phone or by letter within the next 1-3 weeks.  Please call us  at [phone] if you have not heard about the biopsies in 3 weeks.    SIGNATURES/CONFIDENTIALITY: You and/or your care partner have signed paperwork which will be entered into your electronic medical record.  These signatures attest to the fact that that the information above on your After Visit Summary has been reviewed and is understood.  Full responsibility of the confidentiality of this discharge information lies with you and/or your care-partner.

## 2024-10-13 NOTE — Progress Notes (Signed)
 [facility] History and Physical   Primary Care Physician:  Gladis Mustard, FNP   Reason for Procedure:    Encounter Diagnosis  Name Primary?   Special screening for malignant neoplasms, colon Yes     Plan:    Colonoscopy   The patient was provided an opportunity to ask questions and all were answered. The patient agreed with the plan.   HPI: Hannah Reese is a 63 y.o. female presenting for a screening colonoscopy.   Past Medical History:  Diagnosis Date   GERD (gastroesophageal reflux disease)    Recurrent cold sores     Past Surgical History:  Procedure Laterality Date   ABLATION  2012   AUGMENTATION MAMMAPLASTY Bilateral    BLADDER SUSPENSION  06/12/2011   Procedure: TRANSVAGINAL TAPE (TVT) PROCEDURE;  Surgeon: Charlie JONETTA Aho;  Location: [facility];  Service: Gynecology;  Laterality: N/A;  Transobturator   BREAST SURGERY     TUBAL LIGATION       Current Outpatient Medications  Medication Sig Dispense Refill   B Complex Vitamins (B-COMPLEX/B-12) TABS      Cholecalciferol (VITAMIN D) 1000 UNITS capsule Take 1,000 Units by mouth daily.     ibuprofen  (ADVIL ,MOTRIN ) 800 MG tablet Take 1 tablet (800 mg total) by mouth every 8 (eight) hours as needed for pain. 60 tablet 1   Magnesium Oxide -Mg Supplement (MAGNESIUM EXTRA STRENGTH) 400 MG CAPS      omeprazole  (PRILOSEC) 40 MG capsule Take 1 capsule (40 mg total) by mouth daily. 90 capsule 1   valACYclovir  (VALTREX ) 1000 MG tablet Take 1 tablet (1,000 mg total) by mouth daily. 90 tablet 1   cyclobenzaprine  (FLEXERIL ) 5 MG tablet Take 1 tablet (5 mg total) by mouth 3 (three) times daily as needed for muscle spasms. 30 tablet 1   Loratadine 10 MG CAPS      Multiple Vitamins-Minerals (ICAPS AREDS 2 PO)      SEMAGLUTIDE       Current Facility-Administered Medications  Medication Dose Route Frequency Provider Last Rate Last Admin   0.9 %  sodium chloride  infusion  500 mL Intravenous Once Avram Lupita BRAVO, MD         Allergies as of 10/13/2024   (No Known Allergies)    Family History  Problem Relation Age of Onset   Alzheimer's disease Mother    Hypertension Father    Heart disease Father    Cancer Father    Healthy Sister    Healthy Sister    Early death Brother    Colon cancer Neg Hx    Breast cancer Neg Hx    Esophageal cancer Neg Hx    Rectal cancer Neg Hx    Stomach cancer Neg Hx     Social History   Socioeconomic History   Marital status: Divorced    Spouse name: Not on file   Number of children: Not on file   Years of education: Not on file   Highest education level: Associate degree: occupational, scientist, product/process development, or vocational program  Occupational History   Not on file  Tobacco Use   Smoking status: Former    Types: Cigarettes   Smokeless tobacco: Never  Vaping Use   Vaping status: Never Used  Substance and Sexual Activity   Alcohol use: Yes    Comment: weekly   Drug use: No   Sexual activity: Not on file  Other Topics Concern   Not on file  Social History Narrative   Not on file  Social Drivers of Health   Tobacco Use: Medium Risk (10/13/2024)   Patient History    Smoking Tobacco Use: Former    Smokeless Tobacco Use: Never    Passive Exposure: Not on file  Financial Resource Strain: Low Risk (09/29/2024)   Overall Financial Resource Strain (CARDIA)    Difficulty of Paying Living Expenses: Not hard at all  Food Insecurity: No Food Insecurity (09/29/2024)   Epic    Worried About Programme Researcher, Broadcasting/film/video in the Last Year: Never true    Ran Out of Food in the Last Year: Never true  Transportation Needs: No Transportation Needs (09/29/2024)   Epic    Lack of Transportation (Medical): No    Lack of Transportation (Non-Medical): No  Physical Activity: Unknown (09/29/2024)   Exercise Vital Sign    Days of Exercise per Week: Patient declined    Minutes of Exercise per Session: Not on file  Stress: Patient Declined (09/29/2024)   [employer] of  Occupational Health - Occupational Stress Questionnaire    Feeling of Stress: Patient declined  Social Connections: Moderately Integrated (09/29/2024)   Social Connection and Isolation Panel    Frequency of Communication with Friends and Family: More than three times a week    Frequency of Social Gatherings with Friends and Family: Once a week    Attends Religious Services: More than 4 times per year    Active Member of [employer] or Organizations: No    Attends Engineer, Structural: Not on file    Marital Status: Living with partner  Intimate Partner Violence: Not on file  Depression (PHQ2-9): Low Risk (10/03/2024)   Depression (PHQ2-9)    PHQ-2 Score: 0  Alcohol Screen: Low Risk (09/29/2024)   Alcohol Screen    Last Alcohol Screening Score (AUDIT): 3  Housing: Low Risk (09/29/2024)   Epic    Unable to Pay for Housing in the Last Year: No    Number of Times Moved in the Last Year: 0    Homeless in the Last Year: No  Utilities: Not on file  Health Literacy: Not on file    Review of Systems:  All other review of systems negative except as mentioned in the HPI.  Physical Exam: Vital signs BP 115/72   Pulse 68   Temp (!) 97.3 F (36.3 C) (Temporal)   Ht 5' 3 (1.6 m)   Wt 168 lb (76.2 kg)   SpO2 98%   BMI 29.76 kg/m   General:   Alert,  Well-developed, well-nourished, pleasant and cooperative in NAD Lungs:  Clear throughout to auscultation.   Heart:  Regular rate and rhythm; no murmurs, clicks, rubs,  or gallops. Abdomen:  Soft, nontender and nondistended. Normal bowel sounds.   Neuro/Psych:  Alert and cooperative. Normal mood and affect. A and O x 3   @Joanette Silveria  Hannah Commander, MD, [facility] [phone] (pager) 10/13/2024 1:11 PM@

## 2024-10-13 NOTE — Op Note (Signed)
 [facility] Patient Name: Hannah Reese Procedure Date: 10/13/2024 12:46 PM MRN: [id number] Endoscopist: Lupita FORBES Commander , MD, [id number] Age: 63 Referring MD:  Date of Birth: 1960/11/22 Gender: Female Account #: [id number] Procedure:                Colonoscopy Indications:              Screening for colorectal malignant neoplasm, Last                            colonoscopy: May 2015 Medicines:                Monitored Anesthesia Care Procedure:                Pre-Anesthesia Assessment:                           - Prior to the procedure, a History and Physical                            was performed, and patient medications and                            allergies were reviewed. The patient's tolerance of                            previous anesthesia was also reviewed. The risks                            and benefits of the procedure and the sedation                            options and risks were discussed with the patient.                            All questions were answered, and informed consent                            was obtained. Prior Anticoagulants: The patient has                            taken no anticoagulant or antiplatelet agents. ASA                            Grade Assessment: II - A patient with mild systemic                            disease. After reviewing the risks and benefits,                            the patient was deemed in satisfactory condition to                            undergo the procedure.  After obtaining informed consent, the colonoscope                            was passed under direct vision. Throughout the                            procedure, the patient's blood pressure, pulse, and                            oxygen saturations were monitored continuously. The                            CF [id number] #[id number] was introduced through the anus                            and advanced to the the cecum,  identified by                            appendiceal orifice and ileocecal valve. The                            colonoscopy was performed without difficulty. The                            patient tolerated the procedure well. The quality                            of the bowel preparation was good. The ileocecal                            valve, appendiceal orifice, and rectum were                            photographed. The bowel preparation used was SUPREP                            via split dose instruction. Scope In: 1:20:11 PM Scope Out: 1:31:40 PM Scope Withdrawal Time: 0 hours 7 minutes 36 seconds  Total Procedure Duration: 0 hours 11 minutes 29 seconds  Findings:                 The perianal and digital rectal examinations were                            normal.                           Internal hemorrhoids were found.                           The exam was otherwise without abnormality on                            direct and retroflexion views. Complications:            No immediate complications.  Estimated Blood Loss:     Estimated blood loss: none. Impression:               - Internal hemorrhoids.                           - The examination was otherwise normal on direct                            and retroflexion views.                           - No specimens collected. Recommendation:           - Patient has a contact number available for                            emergencies. The signs and symptoms of potential                            delayed complications were discussed with the                            patient. Return to normal activities tomorrow.                            Written discharge instructions were provided to the                            patient.                           - Resume previous diet.                           - Continue present medications.                           - Repeat colonoscopy in 10 years for screening                             purposes. Lupita FORBES Commander, MD 10/13/2024 1:40:31 PM This report has been signed electronically.

## 2024-10-16 ENCOUNTER — Telehealth: Payer: Self-pay | Admitting: *Deleted

## 2024-10-16 NOTE — Telephone Encounter (Signed)
°  Follow up Call-     10/13/2024   12:52 PM  Call back number  Post procedure Call Back phone  # [phone]  Permission to leave phone message Yes     Patient questions:  Do you have a fever, pain , or abdominal swelling? No. Pain Score  0 *  Have you tolerated food without any problems? Yes.    Have you been able to return to your normal activities? Yes.    Do you have any questions about your discharge instructions: Diet   No. Medications  No. Follow up visit  No.  Do you have questions or concerns about your Care? No.  Actions: * If pain score is 4 or above: No action needed, pain <4.

## 2024-10-24 ENCOUNTER — Ambulatory Visit

## 2024-11-14 ENCOUNTER — Other Ambulatory Visit: Payer: Self-pay | Admitting: Nurse Practitioner

## 2024-11-14 DIAGNOSIS — Z1231 Encounter for screening mammogram for malignant neoplasm of breast: Secondary | ICD-10-CM

## 2024-11-15 ENCOUNTER — Ambulatory Visit
Admission: RE | Admit: 2024-11-15 | Discharge: 2024-11-15 | Disposition: A | Discharge status: Home / Self Care | Source: Ambulatory Visit | Attending: Nurse Practitioner | Admitting: Nurse Practitioner

## 2024-11-15 DIAGNOSIS — Z1231 Encounter for screening mammogram for malignant neoplasm of breast: Secondary | ICD-10-CM
